# Patient Record
Sex: Female | Born: 1980 | Race: White | Hispanic: No | Marital: Married | Smoking: Never smoker
Health system: Southern US, Community
[De-identification: ages and names within clinical notes are randomized; demographics above are authoritative.]

## PROBLEM LIST (undated history)

## (undated) DIAGNOSIS — J45909 Unspecified asthma, uncomplicated: Secondary | ICD-10-CM

---

## 2014-06-04 DIAGNOSIS — K644 Residual hemorrhoidal skin tags: Secondary | ICD-10-CM | POA: Insufficient documentation

## 2015-12-09 DIAGNOSIS — F419 Anxiety disorder, unspecified: Secondary | ICD-10-CM | POA: Insufficient documentation

## 2019-06-21 DIAGNOSIS — U071 COVID-19: Secondary | ICD-10-CM

## 2019-06-21 HISTORY — DX: COVID-19: U07.1

## 2019-06-30 ENCOUNTER — Emergency Department: Payer: BC Managed Care – PPO

## 2019-06-30 ENCOUNTER — Encounter: Payer: Self-pay | Admitting: Emergency Medicine

## 2019-06-30 ENCOUNTER — Emergency Department
Admission: EM | Admit: 2019-06-30 | Discharge: 2019-06-30 | Disposition: A | Discharge status: Home / Self Care | Payer: BC Managed Care – PPO | Attending: Emergency Medicine | Admitting: Emergency Medicine

## 2019-06-30 ENCOUNTER — Other Ambulatory Visit: Payer: Self-pay

## 2019-06-30 DIAGNOSIS — J45909 Unspecified asthma, uncomplicated: Secondary | ICD-10-CM | POA: Insufficient documentation

## 2019-06-30 DIAGNOSIS — U071 COVID-19: Secondary | ICD-10-CM | POA: Diagnosis not present

## 2019-06-30 HISTORY — DX: Unspecified asthma, uncomplicated: J45.909

## 2019-06-30 NOTE — ED Triage Notes (Addendum)
FIRST NURSE NOTE- here for [facility] and lungs feeling tight.  covid + on 06/24/18.  Sx since end DEC. sats 100% at check in.  No increased WOB noted after ambulation.

## 2019-06-30 NOTE — ED Triage Notes (Signed)
Pt arrived via POV with reports of cough, shortness of breath, pt has hx of asthma as well.   Pt states she was dx with COVID on 06/25/19.  Pt states sxs started on Dec 26.   No distress noted in triage, pt speaking in complete sentences without difficulty.

## 2019-06-30 NOTE — Discharge Instructions (Addendum)
Follow-up with your primary care provider or [facility] acute care if any continued problems.  Return to the emergency department if any severe worsening of your symptoms or shortness of breath.  Drink lots of fluids to stay hydrated.  You may also take Tylenol as needed for fever, body aches or headache.

## 2019-06-30 NOTE — ED Notes (Signed)
See triage note  States she developed COVID sx's on 12/26  Then she was tested positive on 01/005/2021    States she has had cough for about 10 days   States today has had some discomfort in chest with inspiration   Also has a hx of anxiety   Not sure if this is adding to her sx's today

## 2019-06-30 NOTE — ED Provider Notes (Signed)
[facility] Emergency Department Provider Note   ____________________________________________   First MD Initiated Contact with Patient 06/30/19 1404     (approximate)  I have reviewed the triage vital signs and the nursing notes.   HISTORY  Chief Complaint Cough and COVID Positive   HPI Kathleen Greene is a 39 y.o. female presents to the ED with sensation of shortness of breath and tightness.  Patient states that she was diagnosed with Covid on 06/24/2018 although she began having symptoms since December 26.  Patient states that she does have a history of asthma and has used her inhaler a few times.  She was able to get out yesterday and walk with her husband.  Patient is a non-smoker.  She denies any recent fever or chills.  She states that her taste and smell have returned.  She denies any GI symptoms.  He rates her pain as a 2 out of 10.      Past Medical History:  Diagnosis Date  . Asthma     There are no problems to display for this patient.   History reviewed. No pertinent surgical history.  Prior to Admission medications   Medication Sig Start Date End Date Taking? Authorizing Provider  albuterol (VENTOLIN HFA) 108 (90 Base) MCG/ACT inhaler Inhale 2 puffs into the lungs every 6 (six) hours as needed for wheezing or shortness of breath.   Yes [provider]    Allergies Patient has no known allergies.  No family history on file.  Social History Social History   Tobacco Use  . Smoking status: Never Smoker  . Smokeless tobacco: Never Used  Substance Use Topics  . Alcohol use: Not on file  . Drug use: Not on file    Review of Systems Constitutional: No fever/chills Eyes: No visual changes. ENT: No sore throat. Cardiovascular: Denies chest pain. Respiratory: Denies shortness of breath.  Positive for occasional dry cough. Gastrointestinal: No abdominal pain.  No nausea, no vomiting.  No diarrhea.   Genitourinary: Negative for  dysuria. Musculoskeletal: Negative for muscle aches. Skin: Negative for rash. Neurological: Negative for headaches, focal weakness or numbness. ___________________________________________   PHYSICAL EXAM:  VITAL SIGNS: ED Triage Vitals  Enc Vitals Group     BP 06/30/19 1325 131/90     Pulse Rate 06/30/19 1325 (!) 106     Resp 06/30/19 1325 18     Temp 06/30/19 1325 98.2 F (36.8 C)     Temp Source 06/30/19 1325 Oral     SpO2 06/30/19 1325 100 %     Weight 06/30/19 1324 130 lb (59 kg)     Height 06/30/19 1324 5\' 7"  (1.702 m)     Head Circumference --      Peak Flow --      Pain Score 06/30/19 1323 2     Pain Loc --      Pain Edu? --      Excl. in [place]? --     Constitutional: Alert and oriented. Well appearing and in no acute distress.  Patient is pleasant and is able to sit and talk in complete sentences without any respiratory distress. Eyes: Conjunctivae are normal.  Head: Atraumatic. Neck: No stridor.   Cardiovascular: Normal rate, regular rhythm. Grossly normal heart sounds.  Good peripheral circulation. Respiratory: Normal respiratory effort.  No retractions. Lungs CTAB.  No wheezes, rales or rhonchi are noted. Gastrointestinal: Soft and nontender. No distention. No abdominal bruits.  Musculoskeletal: Moves upper and lower extremities  with any difficulty.  Normal gait was noted. Neurologic:  Normal speech and language. No gross focal neurologic deficits are appreciated. No gait instability. Skin:  Skin is warm, dry and intact. No rash noted. Psychiatric: Mood and affect are normal. Speech and behavior are normal.  ____________________________________________   LABS (all labs ordered are listed, but only abnormal results are displayed)  Labs Reviewed - No data to display  RADIOLOGY  Official radiology report(s): DG Chest 2 View  Result Date: 06/30/2019 CLINICAL DATA:  Shortness of breath.  COVID-19 positive.  Cough. EXAM: CHEST - 2 VIEW COMPARISON:  None.  FINDINGS: The heart size and mediastinal contours are within normal limits. Both lungs are clear. The visualized skeletal structures are unremarkable. IMPRESSION: No active cardiopulmonary disease. Electronically Signed   By: Gerome Sam III M.D   On: 06/30/2019 14:07    ____________________________________________   PROCEDURES  Procedure(s) performed (including Critical Care):  Procedures   ____________________________________________   INITIAL IMPRESSION / ASSESSMENT AND PLAN / ED COURSE  As part of my medical decision making, I reviewed the following data within the electronic MEDICAL RECORD NUMBER Notes from prior ED visits and [place] Controlled Substance Database  Kathleen Greene was evaluated in Emergency Department on 06/30/2019 for the symptoms described in the history of present illness. She was evaluated in the context of the global COVID-19 pandemic, which necessitated consideration that the patient might be at risk for infection with the SARS-CoV-2 virus that causes COVID-19. Institutional protocols and algorithms that pertain to the evaluation of patients at risk for COVID-19 are in a state of rapid change based on information released by regulatory bodies including the CDC and federal and state organizations. These policies and algorithms were followed during the patient's care in the ED.  39 year old female presents to the ED with some concerns about coughing and shortness of breath after being diagnosed with Covid.  Patient states that her initial symptoms began on December 26 and she was diagnosed on 06/25/2019.  Patient states there is been no recent fever or chills, her taste and smell have returned and she is eating well.  Patient has in the past had a history of asthma and has a inhaler that she is used infrequently.  Patient states she was able to walk yesterday outside with her husband.  Chest x-ray is reassuring.  Clinically is doing well.  She is afebrile and O2 sat was 100%.  She is  reassured.  She is encouraged to use her inhaler as needed.  She is aware that she should return to the emergency department if any difficulty breathing or shortness of breath.  ____________________________________________   FINAL CLINICAL IMPRESSION(S) / ED DIAGNOSES  Final diagnoses:  COVID-19 virus infection     ED Discharge Orders    None       Note:  This document was prepared using Dragon voice recognition software and may include unintentional dictation errors.    Tommi Rumps, PA-C 06/30/19 1547    Minna Antis, MD 06/30/19 [phone]

## 2020-02-19 ENCOUNTER — Ambulatory Visit (INDEPENDENT_AMBULATORY_CARE_PROVIDER_SITE_OTHER): Payer: BC Managed Care – PPO

## 2020-02-19 ENCOUNTER — Encounter: Payer: Self-pay | Admitting: Emergency Medicine

## 2020-02-19 ENCOUNTER — Ambulatory Visit
Admission: EM | Admit: 2020-02-19 | Discharge: 2020-02-19 | Disposition: A | Discharge status: Home / Self Care | Payer: BC Managed Care – PPO | Attending: Emergency Medicine | Admitting: Emergency Medicine

## 2020-02-19 ENCOUNTER — Other Ambulatory Visit: Payer: Self-pay

## 2020-02-19 DIAGNOSIS — R0789 Other chest pain: Secondary | ICD-10-CM

## 2020-02-19 DIAGNOSIS — R05 Cough: Secondary | ICD-10-CM

## 2020-02-19 DIAGNOSIS — R42 Dizziness and giddiness: Secondary | ICD-10-CM | POA: Diagnosis not present

## 2020-02-19 DIAGNOSIS — Z8616 Personal history of COVID-19: Secondary | ICD-10-CM

## 2020-02-19 DIAGNOSIS — J4520 Mild intermittent asthma, uncomplicated: Secondary | ICD-10-CM | POA: Diagnosis not present

## 2020-02-19 MED ORDER — MECLIZINE HCL 25 MG PO TABS: 25.0000 mg | ORAL_TABLET | Freq: Three times a day (TID) | ORAL | 0 refills | Status: DC | PRN

## 2020-02-19 MED ORDER — ALBUTEROL SULFATE HFA 108 (90 BASE) MCG/ACT IN AERS: 1.0000 | INHALATION_SPRAY | RESPIRATORY_TRACT | 0 refills | Status: DC | PRN

## 2020-02-19 MED ORDER — MONTELUKAST SODIUM 10 MG PO TABS: 10.0000 mg | ORAL_TABLET | Freq: Every day | ORAL | 0 refills | Status: DC

## 2020-02-19 MED ORDER — AEROCHAMBER PLUS MISC: 2 refills | Status: AC

## 2020-02-19 MED ORDER — ALBUTEROL SULFATE HFA 108 (90 BASE) MCG/ACT IN AERS: 1.0000 | INHALATION_SPRAY | Freq: Four times a day (QID) | RESPIRATORY_TRACT | 0 refills | Status: DC | PRN

## 2020-02-19 NOTE — ED Triage Notes (Addendum)
Patient reports a dry cough for over 1 month. She also reports dizzy spells on/off for 1 month. She states the last 2 days have been worse. She states she tested negative for COVID 2 weeks ago. She also reports that she was positive for COVID in January.

## 2020-02-19 NOTE — Discharge Instructions (Signed)
Take 2 puffs from your albuterol inhaler using your spacer as needed for chest tightness.  I am starting you on Singulair to help prevent bronchospasm and to better control your asthma.  Suspect that this is your asthma.  Continue drinking plenty of fluids, you may want to start drinking some electrolyte containing fluids such as Pedialyte, especially on hot days.  Try the meclizine.

## 2020-02-19 NOTE — ED Provider Notes (Addendum)
HPI  SUBJECTIVE:  Kathleen Greene is a 39 y.o. female who presents with 2 issues.  First, she presents with 1 month of intermittent dizziness described as being lightheaded/off balance and unsteadiness.  She does not describe it as being like vertigo.  It is intermittent, lasting minutes.  No nausea.  Occasional mild headache but not consistently so.  She reports generalized weakness when she is feeling this way.  She states that she has had some left ear fullness, but she is not sure how long it has been there.  She denies ear pain, tinnitus.  No unilateral arm or leg weakness, facial droop, slurred speech.  Reports some diffuse chest tightness and a dry cough that is occasionally associated with the dizziness, but it is not consistently associated with it.  No chest pressure or heaviness, shortness of breath, palpitations, syncope.  Does not take any medications on a regular basis.  No vomiting, nausea, diarrhea.  She states that she drinks over 64 ounces of water a day.    She has tried Dramamine, ibuprofen without improvement in her symptoms.  Symptoms seem to happen when she starts to fall asleep, when she turns her head, and with large positional changes.  She had symptoms like this before when she had Covid in January 2021.  Negative Covid test 12 days ago.  Second, she reports 1 month of occasional intermittent diffuse chest tightness accompanied with a dry cough.  It can last anywhere from 30 to 45 minutes.  She reports occasional expiratory wheezing, but not consistently so.  No shortness of breath.  It is not associated with exertion.  There is no positional component to it.  She denies nasal congestion, sinus pain or pressure, fevers, postnasal drip, GERD symptoms.  No night sweats, unintentional weight loss, allergy symptoms.  This pain does not radiate up her neck, down her arm or through to her back.  No diaphoresis or nausea with this chest tightness.  She is using her rescue albuterol inhaler  several times a day and states that this helped significantly.  She does not have a spacer with it.  Symptoms are worse with going out into the heat, and when she gets anxious.  She denies surgery in the past 4 weeks, recent immobilization, hemoptysis, exogenous estrogen, calf pain or swelling.  She has a past medical history of asthma, IBS, Covid, anxiety, allergies.  No history of diabetes, hypertension, vertigo, stroke, smoking, DVT, PE, cancer, MI, hypercholesterolemia.    Past Medical History:  Diagnosis Date  . Asthma   . COVID-19 06/2019  . IBS (irritable colon syndrome)     History reviewed. No pertinent surgical history.  Family History  Problem Relation Age of Onset  . Idiopathic pulmonary fibrosis Father     Social History   Tobacco Use  . Smoking status: Never Smoker  . Smokeless tobacco: Never Used  Vaping Use  . Vaping Use: Never used  Substance Use Topics  . Alcohol use: Yes  . Drug use: Never    No current facility-administered medications for this encounter.  Current Outpatient Medications:  .  docusate sodium (COLACE) 100 MG capsule, Take 100 mg by mouth 2 (two) times daily., Disp: , Rfl:  .  psyllium (METAMUCIL) 58.6 % packet, Take 1 packet by mouth daily., Disp: , Rfl:  .  albuterol (VENTOLIN HFA) 108 (90 Base) MCG/ACT inhaler, Inhale 1-2 puffs into the lungs every 4 (four) hours as needed for wheezing or shortness of breath., Disp: 1 each,  Rfl: 0 .  meclizine (ANTIVERT) 25 MG tablet, Take 1 tablet (25 mg total) by mouth 3 (three) times daily as needed for dizziness., Disp: 30 tablet, Rfl: 0 .  montelukast (SINGULAIR) 10 MG tablet, Take 1 tablet (10 mg total) by mouth at bedtime., Disp: 30 tablet, Rfl: 0 .  Spacer/Aero-Holding Chambers (AEROCHAMBER PLUS) inhaler, Use as instructed, Disp: 1 each, Rfl: 2  Allergies  Allergen Reactions  . Amoxicillin      ROS  As noted in HPI.   Physical Exam  BP (!) 145/100 (BP Location: Right Arm)   Pulse 99    Temp 98.4 F (36.9 C) (Oral)   Resp 18   Ht 5\' 7"  (1.702 m)   Wt 61.2 kg   LMP 02/05/2020   SpO2 100%   BMI 21.14 kg/m   Constitutional: Well developed, well nourished, no acute distress Eyes:  EOMI, conjunctiva normal bilaterally HENT: Normocephalic, atraumatic,mucus membranes moist TMs normal bilaterally. Respiratory: Normal inspiratory effort lungs clear bilaterally Cardiovascular: Normal rate regular rhythm no murmurs rubs or gallops.  No carotid bruit GI: nondistended skin: No rash, skin intact Musculoskeletal: Calves symmetric, nontender, no edema. Neurologic: Alert & oriented x 3, cranial nerves III through XII intact.  Finger-nose, heel shin within normal limits.  Dix-Hallpike negative.  Romberg negative.  Tandem gait steady. Psychiatric: Speech and behavior appropriate   ED Course   Medications - No data to display  Orders Placed This Encounter  Procedures  . DG Chest 2 View    Standing Status:   Standing    Number of Occurrences:   1    Order Specific Question:   Reason for Exam (SYMPTOM  OR DIAGNOSIS REQUIRED)    Answer:   cough x 1 month  . ED EKG    Standing Status:   Standing    Number of Occurrences:   1    Order Specific Question:   Reason for Exam    Answer:   Other (See Comments)  . EKG 12-Lead    Standing Status:   Standing    Number of Occurrences:   1    No results found for this or any previous visit (from the past 24 hour(s)). DG Chest 2 View  Result Date: 02/19/2020 CLINICAL DATA:  One month history of cough and intermittent dizziness which has worsened over the past 2 days. Patient tested negative for COVID-19 2 weeks ago but states that she was positive in January, 2021. EXAM: CHEST - 2 VIEW COMPARISON:  06/30/2019. FINDINGS: Cardiomediastinal silhouette unremarkable. Lungs clear. Bronchovascular markings normal. Pulmonary vascularity normal. No pneumothorax. No pleural effusions. Visualized bony thorax intact. No interval change. IMPRESSION:  Normal examination. Electronically Signed   By: 08/28/2019 M.D.   On: 02/19/2020 16:45    ED Clinical Impression  1. Chest tightness   2. Intermittent asthma without complication, unspecified asthma severity   3. Lightheadedness      ED Assessment/Plan  1.  Chest tightness/cough.  In the differential is bronchospasm, poorly controlled asthma, GERD.  She has a reassuring EKG and chest x-ray.  She is PERC negative.  Doubt ACS.  Given that response to bronchodilators and with a history of asthma, suspect bronchospasm/poorly controlled asthma.  Will refill her albuterol inhaler, give her a spacer, and restart her on Singulair.  States that she has been on this before with excellent results.  EKG: Normal sinus rhythm, rate 88.  Normal axis, normal intervals.  No hypertrophy.  No ST-T wave changes.  No previous EKG for comparison.  She was asymptomatic while EKG was obtained.  Reviewed imaging independently.  Normal chest x-ray.  See radiology report for full details.  2.  Lightheadedness/dizziness.  Seems to be aggravated with head rotation and large positional changes.  She has had no syncope.  Could be some orthostasis, however patient is not dehydrated.  She has no reason to be dehydrated.  Doubt electrolyte imbalance, or acute anemia.  Thus labs were deferred.  There is no evidence of a stroke, meniere's, otitis media.  It does not appear to be an emergency.  will try some meclizine, continue pushing electrolyte containing fluids.  She is establishing care with a primary care physician at [facility] on September 20.  She may return here if she starts to get worse.   Discussed  MDM, treatment plan, and plan for follow-up with patient. Discussed sn/sx that should prompt return to the ED. patient agrees with plan.   Meds ordered this encounter  Medications  . DISCONTD: albuterol (VENTOLIN HFA) 108 (90 Base) MCG/ACT inhaler    Sig: Inhale 1-2 puffs into the lungs every 6 (six)  hours as needed for wheezing or shortness of breath.    Dispense:  1 each    Refill:  0  . Spacer/Aero-Holding Chambers (AEROCHAMBER PLUS) inhaler    Sig: Use as instructed    Dispense:  1 each    Refill:  2  . montelukast (SINGULAIR) 10 MG tablet    Sig: Take 1 tablet (10 mg total) by mouth at bedtime.    Dispense:  30 tablet    Refill:  0  . meclizine (ANTIVERT) 25 MG tablet    Sig: Take 1 tablet (25 mg total) by mouth 3 (three) times daily as needed for dizziness.    Dispense:  30 tablet    Refill:  0  . albuterol (VENTOLIN HFA) 108 (90 Base) MCG/ACT inhaler    Sig: Inhale 1-2 puffs into the lungs every 4 (four) hours as needed for wheezing or shortness of breath.    Dispense:  1 each    Refill:  0    *This clinic note was created using Scientist, clinical (histocompatibility and immunogenetics). Therefore, there may be occasional mistakes despite careful proofreading.   ?    Domenick Gong, MD 02/19/20 1735    Domenick Gong, MD 02/19/20 [phone]

## 2020-02-21 ENCOUNTER — Other Ambulatory Visit: Payer: Self-pay

## 2020-02-21 ENCOUNTER — Encounter: Payer: Self-pay | Admitting: Emergency Medicine

## 2020-02-21 ENCOUNTER — Ambulatory Visit
Admission: EM | Admit: 2020-02-21 | Discharge: 2020-02-21 | Disposition: A | Discharge status: Home / Self Care | Payer: BC Managed Care – PPO | Attending: Emergency Medicine | Admitting: Emergency Medicine

## 2020-02-21 DIAGNOSIS — J45909 Unspecified asthma, uncomplicated: Secondary | ICD-10-CM | POA: Diagnosis not present

## 2020-02-21 DIAGNOSIS — Z8616 Personal history of COVID-19: Secondary | ICD-10-CM | POA: Insufficient documentation

## 2020-02-21 DIAGNOSIS — Z79899 Other long term (current) drug therapy: Secondary | ICD-10-CM | POA: Diagnosis not present

## 2020-02-21 DIAGNOSIS — R42 Dizziness and giddiness: Secondary | ICD-10-CM | POA: Diagnosis present

## 2020-02-21 DIAGNOSIS — R531 Weakness: Secondary | ICD-10-CM | POA: Diagnosis not present

## 2020-02-21 DIAGNOSIS — R5383 Other fatigue: Secondary | ICD-10-CM | POA: Diagnosis not present

## 2020-02-21 DIAGNOSIS — R448 Other symptoms and signs involving general sensations and perceptions: Secondary | ICD-10-CM | POA: Diagnosis not present

## 2020-02-21 DIAGNOSIS — H53149 Visual discomfort, unspecified: Secondary | ICD-10-CM | POA: Insufficient documentation

## 2020-02-21 DIAGNOSIS — Z880 Allergy status to penicillin: Secondary | ICD-10-CM | POA: Diagnosis not present

## 2020-02-21 LAB — CBC WITH DIFFERENTIAL/PLATELET
Abs Immature Granulocytes: 0.02 10*3/uL (ref 0.00–0.07)
Basophils Absolute: 0 10*3/uL (ref 0.0–0.1)
Basophils Relative: 0 %
Eosinophils Absolute: 0.1 10*3/uL (ref 0.0–0.5)
Eosinophils Relative: 2 %
HCT: 41.2 % (ref 36.0–46.0)
Hemoglobin: 14.3 g/dL (ref 12.0–15.0)
Immature Granulocytes: 0 %
Lymphocytes Relative: 20 %
Lymphs Abs: 1.6 10*3/uL (ref 0.7–4.0)
MCH: 31.2 pg (ref 26.0–34.0)
MCHC: 34.7 g/dL (ref 30.0–36.0)
MCV: 89.8 fL (ref 80.0–100.0)
Monocytes Absolute: 0.6 10*3/uL (ref 0.1–1.0)
Monocytes Relative: 7 %
Neutro Abs: 5.6 10*3/uL (ref 1.7–7.7)
Neutrophils Relative %: 71 %
Platelets: 220 10*3/uL (ref 150–400)
RBC: 4.59 MIL/uL (ref 3.87–5.11)
RDW: 12.3 % (ref 11.5–15.5)
WBC: 8 10*3/uL (ref 4.0–10.5)
nRBC: 0 % (ref 0.0–0.2)

## 2020-02-21 LAB — COMPREHENSIVE METABOLIC PANEL
ALT: 14 U/L (ref 0–44)
AST: 19 U/L (ref 15–41)
Albumin: 4.7 g/dL (ref 3.5–5.0)
Alkaline Phosphatase: 32 U/L — ABNORMAL LOW (ref 38–126)
Anion gap: 9 (ref 5–15)
BUN: 9 mg/dL (ref 6–20)
CO2: 29 mmol/L (ref 22–32)
Calcium: 9.5 mg/dL (ref 8.9–10.3)
Chloride: 102 mmol/L (ref 98–111)
Creatinine, Ser: 0.66 mg/dL (ref 0.44–1.00)
GFR calc Af Amer: >60 mL/min (ref >60)
GFR calc non Af Amer: >60 mL/min (ref >60)
Glucose, Bld: 93 mg/dL (ref 70–99)
Potassium: 3.7 mmol/L (ref 3.5–5.1)
Sodium: 140 mmol/L (ref 135–145)
Total Bilirubin: 0.5 mg/dL (ref 0.3–1.2)
Total Protein: 7.8 g/dL (ref 6.5–8.1)

## 2020-02-21 LAB — URINALYSIS, COMPLETE (UACMP) WITH MICROSCOPIC
Bilirubin Urine: NEGATIVE
Glucose, UA: NEGATIVE mg/dL
Hgb urine dipstick: NEGATIVE
Nitrite: NEGATIVE
Protein, ur: NEGATIVE mg/dL
RBC / HPF: NONE SEEN RBC/hpf (ref 0–5)
Specific Gravity, Urine: 1.02 (ref 1.005–1.030)
pH: 6 (ref 5.0–8.0)

## 2020-02-21 LAB — PREGNANCY, URINE: Preg Test, Ur: NEGATIVE

## 2020-02-21 LAB — TSH: TSH: 2.426 u[IU]/mL (ref 0.350–4.500)

## 2020-02-21 MED ORDER — KETOROLAC TROMETHAMINE 60 MG/2ML IM SOLN
60.0000 mg | Freq: Once | INTRAMUSCULAR | Status: AC
  Administered 2020-02-21: 60 mg via INTRAMUSCULAR

## 2020-02-21 MED ORDER — PREDNISONE 10 MG (21) PO TBPK: ORAL_TABLET | Freq: Every day | ORAL | 0 refills | Status: DC

## 2020-02-21 NOTE — ED Triage Notes (Signed)
Patient c/o light headedness off and on for a month.  Patient denies fever.  Patient states that she was seen here on 02/19/20.

## 2020-02-21 NOTE — ED Provider Notes (Signed)
[facility]    CSN: [id number] Arrival date & time: 02/21/20  1656      History   Chief Complaint Chief Complaint  Patient presents with  . Dizziness    HPI Kathleen Greene is a 39 y.o. female.   Larey Seat presents with complaints of light headedness which has been persistent. Waxes and wanes. Feels weakness and fatigued. She wants to lay down because of sensation. Feels some temple pressure bilaterally. This has been coming and going. Intensity can wax and wane overall. Lights can worsens symptoms, such as going into fluorescent lighting. She states she feels unstable. Symptoms started over a month ago. Has been worse over the past few days. Had covid-19 in January and had somewhat similar sensation of lightheadedness. Recent negative covid testing again. Was seen here on 9/1, also with some asthma symptoms and was started on singuilar, which has helped. "stuffy" feeling but not entirely congested. History of anemia. Last month did have a heavy period for a day. Recent back injury and had been taking aspirin. LMP was 8/18. Not on any birth control. No history of migraines. Sound can worsen symptoms. Has had some occasional headaches in the past but nothing like this. Has been under increased stress recently. Endorses some anxiety but has never had to treat it in the past. Currently doesn't feel light headed. Feels "slightly woozy".  No head injury. No vision changes. Did try meclizine which hasn't helped. Has also taken [facility] occasionally which helps some.   No current chest pain  Or palpitations.   ROS per HPI, negative if not otherwise mentioned.      Past Medical History:  Diagnosis Date  . Asthma   . COVID-19 06/2019  . IBS (irritable colon syndrome)     There are no problems to display for this patient.   History reviewed. No pertinent surgical history.  OB History   No obstetric history on file.      Home Medications    Prior to Admission medications     Medication Sig Start Date End Date Taking? Authorizing Provider  docusate sodium (COLACE) 100 MG capsule Take 100 mg by mouth 2 (two) times daily.   Yes [provider]  meclizine (ANTIVERT) 25 MG tablet Take 1 tablet (25 mg total) by mouth 3 (three) times daily as needed for dizziness. 02/19/20  Yes Domenick Gong, MD  montelukast (SINGULAIR) 10 MG tablet Take 1 tablet (10 mg total) by mouth at bedtime. 02/19/20  Yes Domenick Gong, MD  albuterol (VENTOLIN HFA) 108 (90 Base) MCG/ACT inhaler Inhale 1-2 puffs into the lungs every 4 (four) hours as needed for wheezing or shortness of breath. 02/19/20   Domenick Gong, MD  predniSONE (STERAPRED UNI-PAK 21 TAB) 10 MG (21) TBPK tablet Take by mouth daily. Per box instruction 02/21/20   Linus Mako B, NP  psyllium (METAMUCIL) 58.6 % packet Take 1 packet by mouth daily.    [provider]  Spacer/Aero-Holding Chambers (AEROCHAMBER PLUS) inhaler Use as instructed 02/19/20   Domenick Gong, MD    Family History Family History  Problem Relation Age of Onset  . Idiopathic pulmonary fibrosis Father     Social History Social History   Tobacco Use  . Smoking status: Never Smoker  . Smokeless tobacco: Never Used  Vaping Use  . Vaping Use: Never used  Substance Use Topics  . Alcohol use: Yes  . Drug use: Never     Allergies   Amoxicillin   Review of  Systems Review of Systems   Physical Exam Triage Vital Signs ED Triage Vitals  Enc Vitals Group     BP 02/21/20 1719 130/85     Pulse Rate 02/21/20 1719 (!) 104     Resp 02/21/20 1719 14     Temp 02/21/20 1719 98.4 F (36.9 C)     Temp Source 02/21/20 1719 Oral     SpO2 02/21/20 1719 100 %     Weight 02/21/20 1716 135 lb (61.2 kg)     Height 02/21/20 1716 5\' 7"  (1.702 m)     Head Circumference --      Peak Flow --      Pain Score 02/21/20 1716 0     Pain Loc --      Pain Edu? --      Excl. in GC? --    No data found.  Updated Vital Signs BP 130/85 (BP  Location: Right Arm)   Pulse (!) 104   Temp 98.4 F (36.9 C) (Oral)   Resp 14   Ht 5\' 7"  (1.702 m)   Wt 135 lb (61.2 kg)   LMP 02/05/2020   SpO2 100%   BMI 21.14 kg/m   Visual Acuity Right Eye Distance:   Left Eye Distance:   Bilateral Distance:    Right Eye Near:   Left Eye Near:    Bilateral Near:     Physical Exam Constitutional:      General: She is not in acute distress.    Appearance: She is well-developed.  HENT:     Right Ear: Tympanic membrane and ear canal normal.     Left Ear: Tympanic membrane and ear canal normal.     Mouth/Throat:     Mouth: Mucous membranes are moist.  Eyes:     Pupils: Pupils are equal, round, and reactive to light.  Cardiovascular:     Rate and Rhythm: Normal rate.  Pulmonary:     Effort: Pulmonary effort is normal.  Skin:    General: Skin is warm and dry.  Neurological:     General: No focal deficit present.     Mental Status: She is alert and oriented to person, place, and time. Mental status is at baseline.     Cranial Nerves: No cranial nerve deficit.      UC Treatments / Results  Labs (all labs ordered are listed, but only abnormal results are displayed) Labs Reviewed  COMPREHENSIVE METABOLIC PANEL - Abnormal; Notable for the following components:      Result Value   Alkaline Phosphatase 32 (*)    All other components within normal limits  URINALYSIS, COMPLETE (UACMP) WITH MICROSCOPIC - Abnormal; Notable for the following components:   Ketones, ur TRACE (*)    Leukocytes,Ua TRACE (*)    Bacteria, UA FEW (*)    All other components within normal limits  URINE CULTURE  CBC WITH DIFFERENTIAL/PLATELET  PREGNANCY, URINE  TSH    EKG   Radiology No results found.  Procedures Procedures (including critical care time)  Medications Ordered in UC Medications  ketorolac (TORADOL) injection 60 mg (60 mg Intramuscular Given 02/21/20 1806)    Initial Impression / Assessment and Plan / UC Course  I have reviewed the  triage vital signs and the nursing notes.  Pertinent labs & imaging results that were available during my care of the patient were reviewed by me and considered in my medical decision making (see chart for details).     Non toxic. Benign  physical exam. Migraine variant vs sinus/ inner ear source vs anxiety vs covid long haul type symptoms discussed and considered. ekg and chest xray on 9/1 were unremarkable. Cbc normal here tonight. Trace leuks and bacteria to urine with culture pending. tsh and CMP pending. toradol provided as a migraine treatment, provided prednisone as headache/ sinus treatment option as well. Encouraged close follow up with PCP for recheck as may need further evaluation or management. Return precautions provided. Patient verbalized understanding and agreeable to plan.  Ambulatory out of clinic without difficulty.    Final Clinical Impressions(s) / UC Diagnoses   Final diagnoses:  Light headedness  Photophobia  Facial pressure     Discharge Instructions     Migraine variant vs sinus congestion/ pressure vs covid long haul vs anxiety are things I am considering tonight.  We will check to ensure you have a normal thyroid function, as well as normal electrolytes as well to ensure these are not contributing to your symptoms.  Your urine has slight abnormal findings so I will send it to be cultured to confirm no UTI. Your results will be on your MyChart, I would call you if I am otherwise concerned.  Your CBC has resulted already- you are not anemic.  I am hopeful that the medication we give you tonight helps with the head pressure sensation.  I think you can try a steroid pack as well in hopes that this helps with sinus pressure as well as headache.  Please follow up with your primary care provider for recheck as you may need continued evaluation and management if symptoms persist.     ED Prescriptions    Medication Sig Dispense Auth. Provider   predniSONE (STERAPRED  UNI-PAK 21 TAB) 10 MG (21) TBPK tablet Take by mouth daily. Per box instruction 21 tablet Georgetta Haber, NP     PDMP not reviewed this encounter.   Georgetta Haber, NP 02/21/20 [phone]

## 2020-02-21 NOTE — Discharge Instructions (Addendum)
Migraine variant vs sinus congestion/ pressure vs covid long haul vs anxiety are things I am considering tonight.  We will check to ensure you have a normal thyroid function, as well as normal electrolytes as well to ensure these are not contributing to your symptoms.  Your urine has slight abnormal findings so I will send it to be cultured to confirm no UTI. Your results will be on your MyChart, I would call you if I am otherwise concerned.  Your CBC has resulted already- you are not anemic.  I am hopeful that the medication we give you tonight helps with the head pressure sensation.  I think you can try a steroid pack as well in hopes that this helps with sinus pressure as well as headache.  Please follow up with your primary care provider for recheck as you may need continued evaluation and management if symptoms persist.

## 2020-02-23 LAB — URINE CULTURE: Culture: NO GROWTH

## 2020-02-26 ENCOUNTER — Encounter: Payer: Self-pay | Admitting: Emergency Medicine

## 2020-02-26 ENCOUNTER — Other Ambulatory Visit: Payer: Self-pay

## 2020-02-26 ENCOUNTER — Emergency Department
Admission: EM | Admit: 2020-02-26 | Discharge: 2020-02-26 | Disposition: A | Discharge status: Home / Self Care | Payer: BC Managed Care – PPO | Attending: Emergency Medicine | Admitting: Emergency Medicine

## 2020-02-26 DIAGNOSIS — Z5321 Procedure and treatment not carried out due to patient leaving prior to being seen by health care provider: Secondary | ICD-10-CM | POA: Insufficient documentation

## 2020-02-26 DIAGNOSIS — R5383 Other fatigue: Secondary | ICD-10-CM | POA: Insufficient documentation

## 2020-02-26 DIAGNOSIS — R42 Dizziness and giddiness: Secondary | ICD-10-CM | POA: Diagnosis not present

## 2020-02-26 LAB — POCT PREGNANCY, URINE: Preg Test, Ur: NEGATIVE

## 2020-02-26 NOTE — ED Notes (Signed)
Pt asked for wait time, informed and states she can't wait that long and will follow back up with urgent care tomorrow.  Pt going home.

## 2020-02-26 NOTE — ED Triage Notes (Signed)
Pt to ED from home c/o fatigue, light headedness for 6 months that was been not improving, states came in today d/t bilateral legs "feel wobbly", denies falls.  Denies room spinning sensation, states feels disorienting.  Pt has strong grip strength bilat arms, ambulatory with steady gait, denies difference in sensation, chest rise even and unlabored, in NAD at this time.

## 2020-02-26 NOTE — ED Notes (Signed)
Spoke with Dr. Derrill Kay, no protocols at this time except for POC pregnancy.

## 2020-02-27 ENCOUNTER — Other Ambulatory Visit: Payer: Self-pay

## 2020-02-27 ENCOUNTER — Ambulatory Visit
Admission: EM | Admit: 2020-02-27 | Discharge: 2020-02-27 | Disposition: A | Discharge status: Home / Self Care | Payer: BC Managed Care – PPO | Attending: Internal Medicine | Admitting: Internal Medicine

## 2020-02-27 ENCOUNTER — Encounter: Payer: Self-pay | Admitting: Emergency Medicine

## 2020-02-27 ENCOUNTER — Ambulatory Visit (INDEPENDENT_AMBULATORY_CARE_PROVIDER_SITE_OTHER)
Admit: 2020-02-27 | Discharge: 2020-02-27 | Disposition: A | Discharge status: Home / Self Care | Payer: BC Managed Care – PPO | Attending: Internal Medicine | Admitting: Internal Medicine

## 2020-02-27 DIAGNOSIS — R5383 Other fatigue: Secondary | ICD-10-CM | POA: Insufficient documentation

## 2020-02-27 DIAGNOSIS — R42 Dizziness and giddiness: Secondary | ICD-10-CM | POA: Insufficient documentation

## 2020-02-27 DIAGNOSIS — R531 Weakness: Secondary | ICD-10-CM | POA: Diagnosis not present

## 2020-02-27 MED ORDER — IOHEXOL 300 MG/ML  SOLN
75.0000 mL | Freq: Once | INTRAMUSCULAR | Status: AC | PRN
  Administered 2020-02-27: 75 mL via INTRAVENOUS

## 2020-02-27 NOTE — ED Provider Notes (Signed)
[facility]    CSN: [id number] Arrival date & time: 02/27/20  1205      History   Chief Complaint Chief Complaint  Patient presents with  . Dizziness  . Fatigue    HPI Kathleen Greene is a 39 y.o. female who presents for the 3rd time with persistent light headness that last a few seconds and specially at night when she is starting to fall asleep and gets harder and wakes her up. Has had a loud crash noice in her ears x 1 in the past month. Two nights ago while on prednisone she felt chest tightness and the inhaler did not help and was able to breath fine. This tightness has resolved. Sometimes her heart feels it is feeling hard, but not related to her light headiness. She is having trouble thinking Yesterday while out felt unstable due to muscle weakness and feel heavy and mild achiness.  Denies any new meds or supplements.  Her great aunt on mother's side had an aneurysm. Has celiac dz  Has worked with [employer] and lead 1-2 months before symptoms started , but has wore gloves, but did not wear a mask when wearing sawdering and had well ventilated area.  denies any tick bites that she knows of. Had nl labs and EKG the past 2 times she has been here.  Past Medical History:  Diagnosis Date  . Asthma   . COVID-19 06/2019  . IBS (irritable colon syndrome)     There are no problems to display for this patient.   History reviewed. No pertinent surgical history.  OB History   No obstetric history on file.      Home Medications    Prior to Admission medications   Medication Sig Start Date End Date Taking? Authorizing Provider  albuterol (VENTOLIN HFA) 108 (90 Base) MCG/ACT inhaler Inhale 1-2 puffs into the lungs every 4 (four) hours as needed for wheezing or shortness of breath. 02/19/20  Yes Domenick Gong, MD  docusate sodium (COLACE) 100 MG capsule Take 100 mg by mouth 2 (two) times daily.   Yes [provider]  psyllium (METAMUCIL) 58.6 %  packet Take 1 packet by mouth daily.   Yes [provider]  meclizine (ANTIVERT) 25 MG tablet Take 1 tablet (25 mg total) by mouth 3 (three) times daily as needed for dizziness. 02/19/20   Domenick Gong, MD  montelukast (SINGULAIR) 10 MG tablet Take 1 tablet (10 mg total) by mouth at bedtime. 02/19/20   Domenick Gong, MD  predniSONE (STERAPRED UNI-PAK 21 TAB) 10 MG (21) TBPK tablet Take by mouth daily. Per box instruction 02/21/20   Georgetta Haber, NP  Spacer/Aero-Holding Chambers (AEROCHAMBER PLUS) inhaler Use as instructed 02/19/20   Domenick Gong, MD    Family History Family History  Problem Relation Age of Onset  . Idiopathic pulmonary fibrosis Father     Social History Social History   Tobacco Use  . Smoking status: Never Smoker  . Smokeless tobacco: Never Used  Vaping Use  . Vaping Use: Never used  Substance Use Topics  . Alcohol use: Yes  . Drug use: Never     Allergies   Amoxicillin   Review of Systems Review of Systems  Constitutional: Positive for activity change. Negative for appetite change, chills, diaphoresis, fatigue and fever.  HENT: Negative.   Eyes: Negative.   Respiratory: Negative.   Cardiovascular: Positive for palpitations.  Gastrointestinal: Negative.   Endocrine: Positive for heat intolerance. Negative for polydipsia,  polyphagia and polyuria.  Genitourinary: Negative for difficulty urinating and menstrual problem.  Musculoskeletal: Negative for arthralgias, back pain, gait problem and joint swelling.       Has chronic low back pain and is unchanged and thigh muscle aching is present since HA and dizziness  Skin: Negative for rash.  Allergic/Immunologic: Negative for environmental allergies.  Neurological: Positive for weakness, light-headedness and headaches. Negative for dizziness, tremors, facial asymmetry, speech difficulty and numbness.       Gets intermittent tingling on the soles of her feet  Hematological: Negative for  adenopathy. Does not bruise/bleed easily.  Psychiatric/Behavioral:       Has been grieving since her father died July 29, 2019   Physical Exam Triage Vital Signs ED Triage Vitals  Enc Vitals Group     BP 02/27/20 1236 124/87     Pulse Rate 02/27/20 1236 73     Resp 02/27/20 1236 18     Temp 02/27/20 1236 98.4 F (36.9 C)     Temp Source 02/27/20 1236 Oral     SpO2 02/27/20 1236 100 %     Weight 02/27/20 1234 134 lb 14.7 oz (61.2 kg)     Height 02/27/20 1234 5\' 7"  (1.702 m)     Head Circumference --      Peak Flow --      Pain Score 02/27/20 1234 0     Pain Loc --      Pain Edu? --      Excl. in GC? --    No data found.  Updated Vital Signs BP 124/87 (BP Location: Right Arm)   Pulse 73   Temp 98.4 F (36.9 C) (Oral)   Resp 18   Ht 5\' 7"  (1.702 m)   Wt 134 lb 14.7 oz (61.2 kg)   LMP 02/05/2020   SpO2 100%   BMI 21.13 kg/m   Visual Acuity Right Eye Distance:   Left Eye Distance:   Bilateral Distance:    Right Eye Near:   Left Eye Near:    Bilateral Near:     Physical Exam. Physical Exam Vitals signs and nursing note reviewed.  Constitutional:      General: He is not in acute distress.    Appearance: He is well-developed and normal weight. He is not ill-appearing, toxic-appearing or diaphoretic.  HENT:     Head: Normocephalic.  Eyes:     Extraocular Movements: Extraocular movements intact.     Pupils: Pupils are equal, round, and reactive to light.  Neck:     Musculoskeletal: Neck supple. No neck rigidity.     Meningeal: Brudzinski's sign absent.  Cardiovascular:     Rate and Rhythm: Normal rate and regular rhythm.     Heart sounds: No murmur.  Pulmonary:     Effort: Pulmonary effort is normal.     Breath sounds: Normal breath sounds. No wheezing, rhonchi or rales.  Abdominal:     General: Bowel sounds are normal.     Palpations: Abdomen is soft. There is no mass.     Tenderness: There is no abdominal tenderness. There is no guarding.  Musculoskeletal:  Normal range of motion.  Lymphadenopathy:     Cervical: No cervical adenopathy.  Skin:    General: Skin is warm and dry.  Neurological:     Mental Status: He is alert.     Cranial Nerves: No cranial nerve deficit or facial asymmetry.     Sensory: No sensory deficit.     Motor: No weakness.  Coordination: Romberg sign negative. Coordination normal.     Gait: Gait normal.     Deep Tendon Reflexes: Reflexes normal.     Comments: Normal Romberg, propioception, finger to nose, tandem gait.   Psychiatric:        Mood and Affect: Mood normal.        Speech: Speech normal.        Behavior: Behavior normal.   UC Treatments / Results  Labs (all labs ordered are listed, but only abnormal results are displayed) Labs Reviewed - No data to display  EKG   Radiology No results found.  Procedures Procedures (including critical care time)  Medications Ordered in UC Medications - No data to display  Initial Impression / Assessment and Plan / UC Course  I have reviewed the triage vital signs and the nursing notes. I will call her or sent her all the results via Mychart when they are back.  Final Clinical Impressions(s) / UC Diagnoses   Final diagnoses:  None   Discharge Instructions   None    ED Prescriptions    None     PDMP not reviewed this encounter.   Garey Ham, [place] 03/02/20 0712

## 2020-02-27 NOTE — Discharge Instructions (Addendum)
We will notify you when your test results are back.

## 2020-02-27 NOTE — ED Triage Notes (Signed)
Patient c/o light headedness and extreme muscle fatigue that has been going on x 6 weeks. She has been seen here twice for the same symptoms. She went to the ER last night but the wait is too long. She states she is wanting more imagine or possible referral to Neurology. She has a PCP appointment 9/20 for a new patient.

## 2020-02-28 LAB — HEAVY METALS, BLOOD
Arsenic: 1 ug/L — ABNORMAL LOW (ref 2–23)
Lead: <1 ug/dL (ref 0–4)
Mercury: <1 ug/L (ref 0.0–14.9)

## 2020-02-28 LAB — THYROID ANTIBODIES
Thyroglobulin Antibody: <1 IU/mL (ref 0.0–0.9)
Thyroperoxidase Ab SerPl-aCnc: 9 IU/mL (ref 0–34)

## 2020-03-02 LAB — LYME DISEASE, WESTERN BLOT
IgG P18 Ab.: ABSENT
IgG P23 Ab.: ABSENT
IgG P28 Ab.: ABSENT
IgG P30 Ab.: ABSENT
IgG P39 Ab.: ABSENT
IgG P41 Ab.: ABSENT
IgG P45 Ab.: ABSENT
IgG P58 Ab.: ABSENT
IgG P66 Ab.: ABSENT
IgG P93 Ab.: ABSENT
IgM P23 Ab.: ABSENT
IgM P39 Ab.: ABSENT
IgM P41 Ab.: ABSENT
Lyme IgG Wb: NEGATIVE
Lyme IgM Wb: NEGATIVE

## 2020-03-09 ENCOUNTER — Other Ambulatory Visit: Payer: Self-pay

## 2020-03-09 ENCOUNTER — Encounter: Payer: Self-pay | Admitting: Family Medicine

## 2020-03-09 ENCOUNTER — Ambulatory Visit (INDEPENDENT_AMBULATORY_CARE_PROVIDER_SITE_OTHER): Payer: BC Managed Care – PPO | Admitting: Family Medicine

## 2020-03-09 VITALS — BP 129/83 | HR 91 | Temp 97.8°F | Resp 16 | Ht 67.0 in | Wt 132.2 lb

## 2020-03-09 DIAGNOSIS — J4530 Mild persistent asthma, uncomplicated: Secondary | ICD-10-CM

## 2020-03-09 DIAGNOSIS — K5904 Chronic idiopathic constipation: Secondary | ICD-10-CM | POA: Diagnosis not present

## 2020-03-09 DIAGNOSIS — F419 Anxiety disorder, unspecified: Secondary | ICD-10-CM | POA: Diagnosis not present

## 2020-03-09 DIAGNOSIS — J3089 Other allergic rhinitis: Secondary | ICD-10-CM | POA: Insufficient documentation

## 2020-03-09 DIAGNOSIS — Z7689 Persons encountering health services in other specified circumstances: Secondary | ICD-10-CM

## 2020-03-09 MED ORDER — MONTELUKAST SODIUM 10 MG PO TABS: 10.0000 mg | ORAL_TABLET | Freq: Every day | ORAL | 3 refills | Status: DC

## 2020-03-09 MED ORDER — ALBUTEROL SULFATE HFA 108 (90 BASE) MCG/ACT IN AERS: 1.0000 | INHALATION_SPRAY | RESPIRATORY_TRACT | 2 refills | Status: DC | PRN

## 2020-03-09 NOTE — Patient Instructions (Addendum)
Thank you for coming to the office today.  Singulair (generic) re ordered 90 day with refills for up to 1 year  Added refills to inhaler, Albuterol  Mammogram will be ordered at time of next apt  Will review Neurology report and you can schedule with me whenever interested or needed.    DUE for FASTING BLOOD WORK (no food or drink after midnight before the lab appointment, only water or coffee without cream/sugar on the morning of)  In 1 year, can do lab AFTER appointment.    Please schedule a Follow-up Appointment to: Return in about 1 year (around 03/09/2021) for Follow-up 1 year for Annual Physical + Pap (fasting lab AFTER apt).  If you have any other questions or concerns, please feel free to call the office or send a message through MyChart. You may also schedule an earlier appointment if necessary.  Additionally, you may be receiving a survey about your experience at our office within a few days to 1 week by e-mail or mail. We value your feedback.  Saralyn Pilar, DO [facility], [place]

## 2020-03-09 NOTE — Progress Notes (Signed)
Subjective:    Patient ID: Kathleen Greene, female    DOB: [birth year], 39 y.o.   MRN: [id number]  Kathleen Greene is a 39 y.o. female presenting on 03/09/2020 for [place] (HA but has neuro appt next week, asthma)  Moved to [place] in December 2020, from [place].  HPI   Asthma, mild persistent / Seasonal/Environmental Allergies Chronic history of some allergies, worse seasonal. Has asthma issue with episodic flares, otherwise doing well. She had been on Singulair in past, then came off, now requesting re order on generic. - Has albuterol PRN inhaler rescue, but not needing regularly. Request re order  Dizziness / Lightheadedness / Headache Last few months, has had issues with dizziness and lightheadedness, worse with certain stores with lights and triggers, she would feel a "disconnected" lightheaded feeling, seems episodic, not always provoked, some anxiety component, she ended up going to Urgent Care x 3 in past month. She has had lab work and CXR and EKG and CT Head. - Only abnormal lab was mild low alk phos, she increased electrolytes - Also admits some anxiety related to these symptoms as well. - Could not identify a cause, she was referred to Neurology Dr Sanjuana Mae some photophobia, she admits always having issues with fluorescent lighting in stores. Admits some anxiety related Symptoms are usually not bothering her if sitting and at rest, usually provoked by movement as well. Not able to do as much exercise regularly due to some weakness in muscles. Some improvement with strengthening and exercise. Admits never has taken anxiety medication.  She has been taking Magnesium - Taking some "Natural Calm" on [place], about 370m per dose - Also taking Electrolyte mix drink, without sugar  Anxiety Reports chronic condition underlying, recently with above mentioned health concerns has bothered her more, never taken med, no other mental health problem or depression. She is able to  manage  Chronic Constipation Previous PCP [facility], Dr VPatrecia Pour and had seen [facility],  In past she was [place]. Prior dx IBS-C Now improved on Magnesium Tried Metamucil fiber supplement, without significant improvement. Tried Docusate in past, limited results.   Works as aEngineer, mining vProduct/process development scientistwork commission, EOrson EvaHusband works from home 1 daughter KEasleyMaintenance:  Cervical Cancer Screening: [place] as [place], says prior normal, she will be due for next pap smear in 2022 at her physical.  Breast CA Screening: Age 89 not due, she will pursue this at age 89+next physical will need order. No fam history  No early fam history of Colon CA  Not interested in COVID19 vaccine. She had COVID19 infection 06/2019, her father passed at that time. Father had idiopathic pulmonary fibrosis, he was smoker. took a toll on her, some bereavement circumstantial depression.  Due for Flu Shot, declines today despite counseling on benefits   [facility] 03/09/2020  Decreased Interest 0  Down, Depressed, Hopeless 0  PHQ - 2 Score 0   No flowsheet data found.    Past Medical History:  Diagnosis Date  . Asthma   . COVID-19 06/2019   No past surgical history on file. Social History   Socioeconomic History  . Marital status: Married    Spouse name: Not on file  . Number of children: 1  . Years of education: college  . Highest education level: Not on file  Occupational History  . Occupation: GRisk analyst  Tobacco Use  . Smoking status: Never Smoker  . Smokeless tobacco: Never Used  Vaping Use  . Vaping Use: Never used  Substance and Sexual Activity  . Alcohol use: Yes    Alcohol/week: 1.0 standard drink    Types: 1 Standard drinks or equivalent per week  . Drug use: Never  . Sexual activity: Not on file  Other Topics Concern  . Not  on file  Social History Narrative  . Not on file   Social Determinants of Health   Financial Resource Strain:   . Difficulty of Paying Living Expenses: Not on file  Food Insecurity:   . Worried About Charity fundraiser in the Last Year: Not on file  . Ran Out of Food in the Last Year: Not on file  Transportation Needs:   . Lack of Transportation (Medical): Not on file  . Lack of Transportation (Non-Medical): Not on file  Physical Activity:   . Days of Exercise per Week: Not on file  . Minutes of Exercise per Session: Not on file  Stress:   . Feeling of Stress : Not on file  Social Connections:   . Frequency of Communication with Friends and Family: Not on file  . Frequency of Social Gatherings with Friends and Family: Not on file  . Attends Religious Services: Not on file  . Active Member of Clubs or Organizations: Not on file  . Attends Archivist Meetings: Not on file  . Marital Status: Not on file  Intimate Partner Violence:   . Fear of Current or Ex-Partner: Not on file  . Emotionally Abused: Not on file  . Physically Abused: Not on file  . Sexually Abused: Not on file   Family History  Problem Relation Age of Onset  . Idiopathic pulmonary fibrosis Father    Current Outpatient Medications on File Prior to Visit  Medication Sig  . magnesium citrate SOLN Take by mouth.  . Spacer/Aero-Holding Chambers (AEROCHAMBER PLUS) inhaler Use as instructed   No current facility-administered medications on file prior to visit.    Review of Systems Per HPI unless specifically indicated above     Objective:    BP 129/83   Pulse 91   Temp 97.8 F (36.6 C) (Temporal)   Resp 16   Ht 5' 7"  (1.702 m)   Wt 132 lb 3.2 oz (60 kg)   SpO2 100%   BMI 20.71 kg/m   Wt Readings from Last 3 Encounters:  03/09/20 132 lb 3.2 oz (60 kg)  02/27/20 134 lb 14.7 oz (61.2 kg)  02/21/20 135 lb (61.2 kg)    Physical Exam Vitals and nursing note reviewed.  Constitutional:       General: She is not in acute distress.    Appearance: She is well-developed. She is not diaphoretic.     Comments: Well-appearing, comfortable, cooperative  HENT:     Head: Normocephalic and atraumatic.  Eyes:     General:        Right eye: No discharge.        Left eye: No discharge.     Conjunctiva/sclera: Conjunctivae normal.  Cardiovascular:     Rate and Rhythm: Normal rate.  Pulmonary:     Effort: Pulmonary effort is normal. No respiratory distress.     Breath sounds: Normal breath sounds. No wheezing, rhonchi or rales.  Skin:    General: Skin is warm and dry.     Findings: No erythema or rash.  Neurological:  Mental Status: She is alert and oriented to person, place, and time.  Psychiatric:        Behavior: Behavior normal.     Comments: Well groomed, good eye contact, normal speech and thoughts      CT head 02/27/20  CLINICAL DATA:  Dizziness, nonspecific. Additional history provided: Patient reports lightheadedness and feeling "woozy" for 5 weeks.  EXAM: CT HEAD WITHOUT AND WITH CONTRAST  TECHNIQUE: Contiguous axial images were obtained from the base of the skull through the vertex without and with intravenous contrast  CONTRAST:  34m OMNIPAQUE IOHEXOL 300 MG/ML  SOLN  COMPARISON:  No pertinent prior exams are available for comparison.  FINDINGS: Brain:  Cerebral volume is normal.  There is no acute intracranial hemorrhage.  No demarcated cortical infarct.  No extra-axial fluid collection.  No evidence of intracranial mass.  No midline shift.  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  No abnormal intracranial enhancement.  Vascular: No hyperdense vessel is demonstrated on pre-contrast imaging.  Skull: Normal. Negative for fracture or focal lesion.  Sinuses/Orbits: Visualized orbits show no acute finding. Minimal ethmoid sinus mucosal thickening. No  significant mastoid effusion.  IMPRESSION: No evidence of acute intracranial abnormality.  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 Otherwise unremarkable CT appearance of the brain.  Mild ethmoid sinus mucosal thickening.   Electronically Signed   By: KKellie SimmeringDO   On: 02/27/2020 16:51   Results for orders placed or performed during the hospital encounter of 02/27/20  Lyme disease, western blot  Result Value Ref Range     IgG P93 Ab. Absent      IgG P66 Ab. Absent      IgG P58 Ab. Absent      IgG P45 Ab. Absent      IgG P41 Ab. Absent      IgG P39 Ab. Absent      IgG P30 Ab. Absent      IgG P28 Ab. Absent      IgG P23 Ab. Absent      IgG P18 Ab. Absent    Lyme IgG Wb Negative      IgM P41 Ab. Absent      IgM P39 Ab. Absent      IgM P23 Ab. Absent    Lyme IgM Wb Negative   Thyroid antibodies  Result Value Ref Range   Thyroperoxidase Ab SerPl-aCnc 9 0 - 34 IU/mL   Thyroglobulin Antibody <1.0 0.0 - 0.9 IU/mL  Heavy metals, blood  Result Value Ref Range   Arsenic 1 (L) 2 - 23 ug/L   Mercury <1.0 0.0 - 14.9 ug/L   Lead <1 0 - 4 ug/dL      Assessment & Plan:   Problem List Items Addressed This Visit    Mild persistent asthma without complication - Primary    Stable, without exacerbation Chronic problem Secondary to allergies, other triggers Improved control on Singulair without maintenance inhaler Albuterol PRN flare only  Plan Re order Singulair generic 179mnightly Re order Albuterol PRN use only flares Encourage avoidance of triggers Follow-up if recurrent flares      Relevant Medications   montelukast (SINGULAIR) 10 MG tablet   albuterol (VENTOLIN HFA) 108 (90 Base) MCG/ACT inhaler   Environmental and seasonal allergies    Chronic seasonal Component affecting asthma Re order Singulair      Relevant Medications   montelukast (SINGULAIR) 10 MG tablet  Chronic idiopathic constipation     Improved Chronic problem On magnesium, trial of other meds in past Not taking miralax regularly, but agreeable to re-try more preventative dosing      Relevant Medications   magnesium citrate SOLN   Anxiety    Currently mild flare recently with constellation of symptoms see A&P Stressors loss of father Chronic problem Able to self manage without medication       Other Visit Diagnoses    Encounter to establish care with new doctor          #Request records from prior specialists [facility]YN [place]) / [facility])  #Dizziness/Lightheadedness / Headache Less likely BPPV based on lack of provoked symptoms or vertigo Has upcoming apt with [facility] on 9/27 Dr Melrose Nakayama Head CT results above, no clear cause of symptoms, some anatomical variant finding Reviewed prior urgent care notes, without clear cause of symptoms, lab results reviewed. Future consider other options such as anxiety component if remains unresolved  Meds ordered this encounter  Medications  . montelukast (SINGULAIR) 10 MG tablet    Sig: Take 1 tablet (10 mg total) by mouth at bedtime.    Dispense:  90 tablet    Refill:  3  . albuterol (VENTOLIN HFA) 108 (90 Base) MCG/ACT inhaler    Sig: Inhale 1-2 puffs into the lungs every 4 (four) hours as needed for wheezing or shortness of breath.    Dispense:  1 each    Refill:  2    Follow up plan: Return in about 1 year (around 03/09/2021) for Follow-up 1 year for Annual Physical + Pap (fasting lab AFTER apt).  Nobie Putnam, [facility] 03/09/2020, 3:45 PM

## 2020-03-09 NOTE — Assessment & Plan Note (Signed)
Chronic seasonal Component affecting asthma Re order Singulair

## 2020-03-09 NOTE — Assessment & Plan Note (Signed)
Stable, without exacerbation Chronic problem Secondary to allergies, other triggers Improved control on Singulair without maintenance inhaler Albuterol PRN flare only  Plan Re order Singulair generic 10mg  nightly Re order Albuterol PRN use only flares Encourage avoidance of triggers Follow-up if recurrent flares

## 2020-03-09 NOTE — Assessment & Plan Note (Signed)
Improved Chronic problem On magnesium, trial of other meds in past Not taking miralax regularly, but agreeable to re-try more preventative dosing

## 2020-03-09 NOTE — Assessment & Plan Note (Signed)
Currently mild flare recently with constellation of symptoms see A&P Stressors loss of father Chronic problem Able to self manage without medication

## 2020-07-01 ENCOUNTER — Encounter: Payer: Self-pay | Admitting: Family Medicine

## 2020-07-01 ENCOUNTER — Ambulatory Visit (INDEPENDENT_AMBULATORY_CARE_PROVIDER_SITE_OTHER): Payer: BC Managed Care – PPO | Admitting: Family Medicine

## 2020-07-01 ENCOUNTER — Other Ambulatory Visit: Payer: Self-pay

## 2020-07-01 VITALS — BP 146/94 | HR 114 | Ht 67.0 in | Wt 134.2 lb

## 2020-07-01 DIAGNOSIS — J3489 Other specified disorders of nose and nasal sinuses: Secondary | ICD-10-CM | POA: Diagnosis not present

## 2020-07-01 DIAGNOSIS — G43909 Migraine, unspecified, not intractable, without status migrainosus: Secondary | ICD-10-CM | POA: Diagnosis not present

## 2020-07-01 DIAGNOSIS — Z2821 Immunization not carried out because of patient refusal: Secondary | ICD-10-CM

## 2020-07-01 MED ORDER — SUMATRIPTAN SUCCINATE 50 MG PO TABS: 50.0000 mg | ORAL_TABLET | Freq: Once | ORAL | 2 refills | Status: AC | PRN

## 2020-07-01 NOTE — Progress Notes (Signed)
Subjective:    Patient ID: Larey Seat, female    DOB: 08/17/1980, 40 y.o.   MRN: [id number]  Kathleen Greene is a 40 y.o. female presenting on 07/01/2020 for Ear Pain and Headache   HPI   Dizziness / Lightheadedness / Headache - Last visit with me 03/09/20, for initial visit for same problem lightheaded dizziness, treated with Montelukast and referral to Neurology at [facility], see prior notes for background information. - Interval update with saw Dr Milana Huntsman Neuro on 03/16/20 - thought to be anxiety, was offered Lexapro did not start. She was doing very well with regards to anxiety had improved this overall through understanding her own fight or flight response and self guided cognitive behavioral therapy - Today patient reports now 1 week ago new onset symptoms return with sudden episodes and more frequent spells with increased intensity with feeling "woozy" and Lightheaded. Now again 2.5 days ago started getting headache. She was feeling tired and fatigued and worn out. She had episodes of fullness in her chin bilateral, and numbness in chin. - Now admits still persistent lethargy, lightheadedness, pressure in temples described as pulsing headache - Improved w/ Ibuprofen 200mg  x 3 and resting, laying down - No distinct sinus congestion or drainage - Admits some Left ear pain or pressure possibly radiating to that area Admits associated photophobia - No prior history of migraine headaches. Never on treatment - Prior CT 02/2020 after Emergency Department visit for Dizziness Denies fever chills bodyaches, change of test or smell, ear drainage  Health Maintenance: Unvaccinated against COVID or Flu, declines  Depression screen [facility] 2/9 07/01/2020 07/01/2020 03/09/2020  Decreased Interest 0 0 0  Down, Depressed, Hopeless 0 0 0  PHQ - 2 Score 0 0 0    Social History   Tobacco Use   Smoking status: Never Smoker   Smokeless tobacco: Never Used  Vaping Use   Vaping Use: Never used  Substance Use  Topics   Alcohol use: Yes    Alcohol/week: 1.0 standard drink    Types: 1 Standard drinks or equivalent per week   Drug use: Never    Review of Systems Per HPI unless specifically indicated above     Objective:    BP (!) 146/94    Pulse (!) 114    Ht 5\' 7"  (1.702 m)    Wt 134 lb 3.2 oz (60.9 kg)    SpO2 100%    BMI 21.02 kg/m   Wt Readings from Last 3 Encounters:  07/01/20 134 lb 3.2 oz (60.9 kg)  03/09/20 132 lb 3.2 oz (60 kg)  02/27/20 134 lb 14.7 oz (61.2 kg)    Physical Exam Vitals and nursing note reviewed.  Constitutional:      General: She is not in acute distress.    Appearance: She is well-developed and well-nourished. She is not diaphoretic.     Comments: Well-appearing, comfortable, cooperative  HENT:     Head: Normocephalic and atraumatic.     Right Ear: Tympanic membrane, ear canal and external ear normal. There is no impacted cerumen.     Left Ear: Ear canal and external ear normal. There is no impacted cerumen.     Ears:     Comments: Possible mild effusion clear fluid minimal L>R    Mouth/Throat:     Mouth: Oropharynx is clear and moist.  Eyes:     General:        Right eye: No discharge.        Left  eye: No discharge.     Conjunctiva/sclera: Conjunctivae normal.  Neck:     Thyroid: No thyromegaly.  Cardiovascular:     Rate and Rhythm: Normal rate and regular rhythm.     Pulses: Intact distal pulses.     Heart sounds: Normal heart sounds. No murmur heard.   Pulmonary:     Effort: Pulmonary effort is normal. No respiratory distress.     Breath sounds: Normal breath sounds. No wheezing or rales.  Musculoskeletal:        General: No edema. Normal range of motion.     Cervical back: Normal range of motion and neck supple.  Lymphadenopathy:     Cervical: No cervical adenopathy.  Skin:    General: Skin is warm and dry.     Findings: No erythema or rash.  Neurological:     Mental Status: She is alert and oriented to person, place, and time. Mental  status is at baseline.     Cranial Nerves: No cranial nerve deficit or facial asymmetry.     Sensory: No sensory deficit.     Motor: No weakness.     Coordination: Coordination normal.  Psychiatric:        Mood and Affect: Mood and affect normal.        Behavior: Behavior normal.     Comments: Well groomed, good eye contact, normal speech and thoughts      I have personally reviewed the radiology report from CT Head 02/27/20  CLINICAL DATA:  Dizziness, nonspecific. Additional history provided: Patient reports lightheadedness and feeling "woozy" for 5 weeks.  EXAM: CT HEAD WITHOUT AND WITH CONTRAST  TECHNIQUE: Contiguous axial images were obtained from the base of the skull through the vertex without and with intravenous contrast  CONTRAST:  51mL OMNIPAQUE IOHEXOL 300 MG/ML  SOLN  COMPARISON:  No pertinent prior exams are available for comparison.  FINDINGS: Brain:  Cerebral volume is normal.  There is no acute intracranial hemorrhage.  No demarcated cortical infarct.  No extra-axial fluid collection.  No evidence of intracranial mass.  No midline shift.  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  No abnormal intracranial enhancement.  Vascular: No hyperdense vessel is demonstrated on pre-contrast imaging.  Skull: Normal. Negative for fracture or focal lesion.  Sinuses/Orbits: Visualized orbits show no acute finding. Minimal ethmoid sinus mucosal thickening. No significant mastoid effusion.  IMPRESSION: No evidence of acute intracranial abnormality.  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 Otherwise unremarkable CT appearance of the brain.  Mild ethmoid sinus mucosal thickening.   Electronically Signed   By: Jackey Loge DO   On: 02/27/2020 16:51  Results for orders placed or performed during the hospital encounter  of 02/27/20  Lyme disease, western blot  Result Value Ref Range     IgG P93 Ab. Absent      IgG P66 Ab. Absent      IgG P58 Ab. Absent      IgG P45 Ab. Absent      IgG P41 Ab. Absent      IgG P39 Ab. Absent      IgG P30 Ab. Absent      IgG P28 Ab. Absent      IgG P23 Ab. Absent      IgG P18 Ab. Absent    Lyme IgG Wb Negative      IgM P41 Ab. Absent      IgM P39  Ab. Absent      IgM P23 Ab. Absent    Lyme IgM Wb Negative   Thyroid antibodies  Result Value Ref Range   Thyroperoxidase Ab SerPl-aCnc 9 0 - 34 IU/mL   Thyroglobulin Antibody <1.0 0.0 - 0.9 IU/mL  Heavy metals, blood  Result Value Ref Range   Arsenic 1 (L) 2 - 23 ug/L   Mercury <1.0 0.0 - 14.9 ug/L   Lead <1 0 - 4 ug/dL      Assessment & Plan:   Problem List Items Addressed This Visit    COVID-19 vaccination declined    Other Visit Diagnoses    Migraine without status migrainosus, not intractable, unspecified migraine type    -  Primary   Relevant Medications   SUMAtriptan (IMITREX) 50 MG tablet   Sinus pressure          Consistent with suspected migraine HA Prior possible similar onset last year, now has returned and worsening Consider Cluster Headaches given timeline Associated symptoms and overall syndrome characteristic of migraine headaches, likely with some neurological component with vestibular / dizziness Today without acute migraine or headache - Inadequately treated for migraine HA at home  Plan: 1. Start abortive therapy with Sumatriptan 50mg  tabs - take 1 PRN (#12, 0 refill due to quantity limit), severe HA, may repeat dose within 2 hr if persistent, no more in 24 hours, in future can titrate dose to 50-100mg  if needed. Counseling on potential side effect / intolerance with chest discomfort acutely after taking sumatriptan 2. Recommend taking Ibuprofen 600-800mg  q 8 hr PRN, and can try Tylenol 1000mg  TID alternatively 3. Avoid triggers including foods, caffeine. Important to rest. - Discussion  on future migraine prophylaxis medications - handout given, review options at next visit, determine need if using sumatriptan frequently 4. She may start headache diary, handout given, identify triggers for avoidance, bring to next visit  Return criteria given for acute migraine, when to go to office vs ED  Additionally she has already been evaluated by Dr at [facility] in past with original onset of symptoms. Discussed today that yes she may have an anxiety component as thought before, could explain elevated BP acutely with some white coat HTN here at doctors office, but do not think anxiety or BP are primary provoking factors for her symptoms. She may return to Neurology in future if needing further evaluation for headaches.  Recommend follow-up within 4 weeks to discuss progress or modify the treatment plan.   Meds ordered this encounter  Medications   SUMAtriptan (IMITREX) 50 MG tablet    Sig: Take 1 tablet (50 mg total) by mouth once as needed for up to 1 dose for migraine. May repeat one dose in 2 hours if headache persists, for max dose 24 hours    Dispense:  12 tablet    Refill:  2      Follow up plan: Return in about 4 weeks (around 07/29/2020) for 4 weeks headache / migraine follow-up.   [facility], DO [facility] 07/01/2020, 4:14 PM

## 2020-07-01 NOTE — Patient Instructions (Addendum)
Thank you for coming to the office today.  1. You most likely have Chronic Migraine Headaches - Migraine headaches present differently for many patients, pain is usually throbbing or aching, often on one side of head or behind the eye. They tend to last for up to hours or days. In treating migraines, our goal is to 1) stop the headache and 2) prevent recurrence of headaches  Treatment to STOP the headache at this time: - Start with Sumatriptan 50mg  - take 1 immediately at onset of moderate to severe migraine headache, if unresolved or return within 2 hours then repeat dose 1 tablet, that is max dose for 24 hours. (Note - this medication can cause a brief episode of flushing and chest pressure or pain very soon after taking it. That is NORMAL, and it is the medicine taking effect and dilating some blood vessels. It should pass, and resolve within seconds to minutes after - it may not happen at all)  - Try this for 1-2 weeks, if absolutely NOT helping then contact me to discuss changes, possibly can double sumatriptan dose or try nasal spray - You can still take over the counter meds with Ibuprofen up to 600-800mg  per dose 3 times a day with food for a few days and may try Excedrin Migraine as needed, ONLY use these after you have tried Sumatriptan up to 2 doses, and try to limit their use if they are not effective  Treatment to PREVENT headaches: - Goal is to avoid triggers. We need to learn more details on what are your exact or possible headache triggers first. - Keep detailed headache diary (on printed handout) for possible triggers, bring this to your next visit to discuss further - Known possible triggers include caffeine, chocolate, alcohol, stress, weather changes, menstrual cycle, certain other foods - Also be aware that OTC pain meds/anti-inflammatories can cause rebound headache, they help resolve the headache but then after the effect wears off they can CAUSE a headache. Try to taper down  and stop these medications and allow them to get out of your system for 1-2 weeks   Look into some of these alternatives for Migraine Headache Prophylaxis or preventative treatment  Antidepressant Type - Venlafaxine, Amitriptyline Blood pressure meds - Metoprolol, Propanolol, Verapamil Anti Headache/Seizure/Nerve medications - Topamax, Gabapentin  Future medication options that can be considered if persistent severe migraines and if failed some of the above prevention medicines include: - Aimovig, Emgality, Ajovy  Nurtec ODT is another very popular migraine medicine now as well. Can be used to prevent or stop migraines.  (these are once a month self injection medications that work directly to block migraine signals. They are relatively new, can be costly and have been proven to be very safe and effective. We can try to get them approved or for low cost if needed in future.)    Please schedule a Follow-up Appointment to: Return in about 4 weeks (around 07/29/2020) for 4 weeks headache / migraine follow-up.  If you have any other questions or concerns, please feel free to call the office or send a message through MyChart. You may also schedule an earlier appointment if necessary.  Additionally, you may be receiving a survey about your experience at our office within a few days to 1 week by e-mail or mail. We value your feedback.  09/26/2020, DO [facility], [facility]

## 2020-07-02 ENCOUNTER — Ambulatory Visit: Payer: BC Managed Care – PPO | Admitting: Family Medicine

## 2020-11-20 ENCOUNTER — Encounter: Payer: Self-pay | Admitting: Family Medicine

## 2020-11-20 ENCOUNTER — Ambulatory Visit (INDEPENDENT_AMBULATORY_CARE_PROVIDER_SITE_OTHER): Payer: BC Managed Care – PPO | Admitting: Family Medicine

## 2020-11-20 ENCOUNTER — Other Ambulatory Visit: Payer: Self-pay

## 2020-11-20 VITALS — BP 124/83 | HR 112 | Ht 67.0 in | Wt 135.6 lb

## 2020-11-20 DIAGNOSIS — F321 Major depressive disorder, single episode, moderate: Secondary | ICD-10-CM

## 2020-11-20 DIAGNOSIS — G8929 Other chronic pain: Secondary | ICD-10-CM

## 2020-11-20 DIAGNOSIS — M6281 Muscle weakness (generalized): Secondary | ICD-10-CM

## 2020-11-20 DIAGNOSIS — F419 Anxiety disorder, unspecified: Secondary | ICD-10-CM | POA: Diagnosis not present

## 2020-11-20 DIAGNOSIS — M2550 Pain in unspecified joint: Secondary | ICD-10-CM | POA: Diagnosis not present

## 2020-11-20 MED ORDER — ESCITALOPRAM OXALATE 10 MG PO TABS: 10.0000 mg | ORAL_TABLET | Freq: Every day | ORAL | 3 refills | Status: DC

## 2020-11-20 NOTE — Progress Notes (Signed)
Subjective:    Patient ID: Kathleen Greene, female    DOB: 1980/12/15, 40 y.o.   MRN: [id number]  Kathleen Greene is a 40 y.o. female presenting on 11/20/2020 for Fatigue, Dizziness, and Anxiety   HPI   Anxiety mixed depression Fatigue Constellation of muscle weakness / aches and joint pain, chronic  Follow-up previous concerns:  She had history of jaw clenching with anxiety, causing tension headaches. Has improved.  History of COVID 1.5 years ago. Believes most of these symptoms onset since then.  She was seen by [facility] 02/2020, and saw Dr Melrose Nakayama, and they ordered Escitalopram 49m in past but she did not take it. Her mother takes it and does well. - she has tried multiple supplements  She describes "fatigue" throughout her entire body, not just her legs. She says that this symptom is  She sleeps well but does not feel rested when gets up. She feels like legs and arms are heavy and arms are just "noodles". - Post-exertional malaise, very common problem since COVID. She has not been able to return to regular function since that time.  Also lost her father, passed away 203-12-2019 Has taken a major toll on her mentally.   Reports chronic condition underlying, recently with above mentioned health concerns has bothered her more, never taken med, no other mental health problem or depression. She is able to manage  Lichen Planus, oral Stress with father passed. Triggered this. She follows w/   She has history of possible Celiac Disease, and she game off of gluten. Admits propensity for autoimmune problem.  Psoriasis on abdomen at times.   [facility] 11/20/2020 07/01/2020 07/01/2020  Decreased Interest 2 0 0  Down, Depressed, Hopeless 2 0 0  PHQ - 2 Score 4 0 0  Altered sleeping 2 - -  Tired, decreased energy 3 - -  Change in appetite 0 - -  Feeling bad or failure about yourself  1 - -  Trouble concentrating 0 - -  Moving slowly or fidgety/restless 3 - -  Suicidal thoughts  0 - -  PHQ-9 Score 13 - -  Difficult doing work/chores Very difficult - -   GAD 7 : Generalized Anxiety Score 11/20/2020  Nervous, Anxious, on Edge 2  Control/stop worrying 2  Worry too much - different things 2  Trouble relaxing 2  Restless 0  Easily annoyed or irritable 2  Afraid - awful might happen 2  Total GAD 7 Score 12  Anxiety Difficulty Very difficult      Social History   Tobacco Use  . Smoking status: Never Smoker  . Smokeless tobacco: Never Used  Vaping Use  . Vaping Use: Never used  Substance Use Topics  . Alcohol use: Yes    Alcohol/week: 1.0 standard drink    Types: 1 Standard drinks or equivalent per week  . Drug use: Never    Review of Systems Per HPI unless specifically indicated above     Objective:    BP 124/83   Pulse (!) 112   Ht 5' 7"  (1.702 m)   Wt 135 lb 9.6 oz (61.5 kg)   SpO2 100%   BMI 21.24 kg/m   Wt Readings from Last 3 Encounters:  11/20/20 135 lb 9.6 oz (61.5 kg)  07/01/20 134 lb 3.2 oz (60.9 kg)  03/09/20 132 lb 3.2 oz (60 kg)    Physical Exam Vitals and nursing note reviewed.  Constitutional:      General: She is not  in acute distress.    Appearance: She is well-developed. She is not diaphoretic.     Comments: Well-appearing, comfortable, cooperative  HENT:     Head: Normocephalic and atraumatic.  Eyes:     General:        Right eye: No discharge.        Left eye: No discharge.     Conjunctiva/sclera: Conjunctivae normal.  Cardiovascular:     Rate and Rhythm: Normal rate.  Pulmonary:     Effort: Pulmonary effort is normal.  Skin:    General: Skin is warm and dry.     Findings: No erythema or rash.  Neurological:     Mental Status: She is alert and oriented to person, place, and time.  Psychiatric:        Behavior: Behavior normal.     Comments: Well groomed, good eye contact, normal speech and thoughts    Results for orders placed or performed during the hospital encounter of 02/27/20  Lyme disease, western  blot  Result Value Ref Range     IgG P93 Ab. Absent      IgG P66 Ab. Absent      IgG P58 Ab. Absent      IgG P45 Ab. Absent      IgG P41 Ab. Absent      IgG P39 Ab. Absent      IgG P30 Ab. Absent      IgG P28 Ab. Absent      IgG P23 Ab. Absent      IgG P18 Ab. Absent    Lyme IgG Wb Negative      IgM P41 Ab. Absent      IgM P39 Ab. Absent      IgM P23 Ab. Absent    Lyme IgM Wb Negative   Thyroid antibodies  Result Value Ref Range   Thyroperoxidase Ab SerPl-aCnc 9 0 - 34 IU/mL   Thyroglobulin Antibody <1.0 0.0 - 0.9 IU/mL  Heavy metals, blood  Result Value Ref Range   Arsenic 1 (L) 2 - 23 ug/L   Mercury <1.0 0.0 - 14.9 ug/L   Lead <1 0 - 4 ug/dL      Assessment & Plan:   Problem List Items Addressed This Visit    Anxiety - Primary   Relevant Medications   escitalopram (LEXAPRO) 10 MG tablet    Other Visit Diagnoses    Generalized muscle weakness       Relevant Orders   Sed Rate (ESR)   C-reactive protein   Cyclic citrul peptide antibody, IgG   Rheumatoid Factor   ANA   CBC with Differential/Platelet   COMPLETE METABOLIC PANEL WITH GFR   Chronic joint pain       Relevant Medications   escitalopram (LEXAPRO) 10 MG tablet   Other Relevant Orders   Sed Rate (ESR)   C-reactive protein   Cyclic citrul peptide antibody, IgG   Rheumatoid Factor   ANA   CBC with Differential/Platelet   COMPLETE METABOLIC PANEL WITH GFR   Current moderate episode of major depressive disorder without prior episode (HCC)       Relevant Medications   escitalopram (LEXAPRO) 10 MG tablet      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 Depression seems secondary to the anxiety and stress No prior medication management Has tried self  management techniques and supplements  Trial on SSRI Escitalopram 83m daily, counseling  provided, may take few weeks, can adjust dose if indicated. May help assist with her overall symptoms if some anxiety provoked. Can help with rest/sleep as well  Additional concern for constellation of chronic symptoms Chronic joint pain multiple areas, muscle aches and fatigue Question if Post COVID Chronic symptoms or if Chronic Fatigue Syndrome  Also with history of autoimmune concerns, history of lichen planus and possible psoriasis lesions  Will pursue broad lab testing with inflammatory autoimmune panel today ESR CRP RF ANA CCP and chemistry cbc  If identify abnormality or if negative and still suspicion for rheumatological vs autoimmune condition may refer to Rheumatology in near future   Orders Placed This Encounter  Procedures  . Sed Rate (ESR)  . C-reactive protein  . Cyclic citrul peptide antibody, IgG  . Rheumatoid Factor  . ANA  . CBC with Differential/Platelet  . COMPLETE METABOLIC PANEL WITH GFR     Meds ordered this encounter  Medications  . escitalopram (LEXAPRO) 10 MG tablet    Sig: Take 1 tablet (10 mg total) by mouth daily.    Dispense:  30 tablet    Refill:  3     Follow up plan: Return in about 4 weeks (around 12/18/2020) for 4 week follow-up anxiety / rheumatology lab updates/referral?.   ANobie Putnam [facility] 11/20/2020, 11:21 AM

## 2020-11-20 NOTE — Patient Instructions (Addendum)
Thank you for coming to the office today.  Labs today for autoimmune screening. Follow results, we may refer regardless.  Start the Escitalopram lexapro 10mg  daily, may take 2-4 weeks  Check in 4-6 weeks for updates, sooner if need for referral or other issues.   RHEUMATOLOGY  [facility]. DEGERFORS, MD Rheumatology [facility] [address] [place], [place], [place], [place]  [phone]  [facility] [address] [place], [place] Hours: M-Th 8-5pm / F 8-12noon Phone: [phone]  [facility] [address], [address] [place], [place] Phone: [phone]   Please schedule a Follow-up Appointment to: Return in about 4 weeks (around 12/18/2020) for 4 week follow-up anxiety / rheumatology lab updates/referral?.  If you have any other questions or concerns, please feel free to call the office or send a message through MyChart. You may also schedule an earlier appointment if necessary.  Additionally, you may be receiving a survey about your experience at our office within a few days to 1 week by e-mail or mail. We value your feedback.  02/18/2021, DO [facility], [facility]

## 2020-11-23 LAB — COMPLETE METABOLIC PANEL WITH GFR
AG Ratio: 1.9 (calc) (ref 1.0–2.5)
ALT: 11 U/L (ref 6–29)
AST: 14 U/L (ref 10–30)
Albumin: 5 g/dL (ref 3.6–5.1)
Alkaline phosphatase (APISO): 36 U/L (ref 31–125)
BUN: 10 mg/dL (ref 7–25)
CO2: 28 mmol/L (ref 20–32)
Calcium: 9.9 mg/dL (ref 8.6–10.2)
Chloride: 103 mmol/L (ref 98–110)
Creat: 0.71 mg/dL (ref 0.50–1.10)
GFR, Est African American: 124 mL/min/{1.73_m2} (ref >=60)
GFR, Est Non African American: 107 mL/min/{1.73_m2} (ref >=60)
Globulin: 2.7 g/dL (calc) (ref 1.9–3.7)
Glucose, Bld: 99 mg/dL (ref 65–99)
Potassium: 3.8 mmol/L (ref 3.5–5.3)
Sodium: 140 mmol/L (ref 135–146)
Total Bilirubin: 0.6 mg/dL (ref 0.2–1.2)
Total Protein: 7.7 g/dL (ref 6.1–8.1)

## 2020-11-23 LAB — CBC WITH DIFFERENTIAL/PLATELET
Absolute Monocytes: 563 cells/uL (ref 200–950)
Basophils Absolute: 17 cells/uL (ref 0–200)
Basophils Relative: 0.2 %
Eosinophils Absolute: 59 cells/uL (ref 15–500)
Eosinophils Relative: 0.7 %
HCT: 43 % (ref 35.0–45.0)
Hemoglobin: 14.8 g/dL (ref 11.7–15.5)
Lymphs Abs: 1336 cells/uL (ref 850–3900)
MCH: 32.2 pg (ref 27.0–33.0)
MCHC: 34.4 g/dL (ref 32.0–36.0)
MCV: 93.5 fL (ref 80.0–100.0)
MPV: 11.1 fL (ref 7.5–12.5)
Monocytes Relative: 6.7 %
Neutro Abs: 6426 cells/uL (ref 1500–7800)
Neutrophils Relative %: 76.5 %
Platelets: 206 10*3/uL (ref 140–400)
RBC: 4.6 10*6/uL (ref 3.80–5.10)
RDW: 12.3 % (ref 11.0–15.0)
Total Lymphocyte: 15.9 %
WBC: 8.4 10*3/uL (ref 3.8–10.8)

## 2020-11-23 LAB — C-REACTIVE PROTEIN: CRP: 0.4 mg/L (ref <8.0)

## 2020-11-23 LAB — SEDIMENTATION RATE: Sed Rate: 2 mm/h (ref 0–20)

## 2020-11-23 LAB — CYCLIC CITRUL PEPTIDE ANTIBODY, IGG: Cyclic Citrullin Peptide Ab: <16 UNITS

## 2020-11-23 LAB — RHEUMATOID FACTOR: Rheumatoid fact SerPl-aCnc: <14 IU/mL (ref <14)

## 2020-11-23 LAB — ANA: Anti Nuclear Antibody (ANA): NEGATIVE

## 2020-12-12 ENCOUNTER — Other Ambulatory Visit: Payer: Self-pay | Admitting: Family Medicine

## 2020-12-12 DIAGNOSIS — F419 Anxiety disorder, unspecified: Secondary | ICD-10-CM

## 2020-12-12 NOTE — Telephone Encounter (Signed)
Last RF 11/20/20 #30 3 RF-- 90 day RF - pt due for refill

## 2021-02-27 ENCOUNTER — Other Ambulatory Visit: Payer: Self-pay | Admitting: Family Medicine

## 2021-02-27 DIAGNOSIS — J3089 Other allergic rhinitis: Secondary | ICD-10-CM

## 2021-02-27 DIAGNOSIS — F419 Anxiety disorder, unspecified: Secondary | ICD-10-CM

## 2021-02-27 DIAGNOSIS — J4530 Mild persistent asthma, uncomplicated: Secondary | ICD-10-CM

## 2021-02-27 NOTE — Telephone Encounter (Signed)
Requested Prescriptions  Pending Prescriptions Disp Refills  . montelukast (SINGULAIR) 10 MG tablet [Pharmacy Med Name: MONTELUKAST SOD 10 MG TABLET] 90 tablet 3    Sig: TAKE 1 TABLET BY MOUTH EVERYDAY AT BEDTIME     Pulmonology:  Leukotriene Inhibitors Passed - 02/27/2021  9:38 AM      Passed - Valid encounter within last 12 months    Recent Outpatient Visits          3 months ago Anxiety   [facility] [place], Netta Neat, DO   8 months ago Migraine without status migrainosus, not intractable, unspecified migraine type   [facility] Smitty Cords, DO   11 months ago Mild persistent asthma without complication   [facility], Netta Neat, DO             . escitalopram (LEXAPRO) 10 MG tablet [Pharmacy Med Name: ESCITALOPRAM 10 MG TABLET] 90 tablet 0    Sig: TAKE 1 TABLET BY MOUTH EVERY DAY     Psychiatry:  Antidepressants - SSRI Passed - 02/27/2021  9:38 AM      Passed - Valid encounter within last 6 months    Recent Outpatient Visits          3 months ago Anxiety   [facility], Netta Neat, DO   8 months ago Migraine without status migrainosus, not intractable, unspecified migraine type   [facility] Smitty Cords, DO   11 months ago Mild persistent asthma without complication   [facility] [place], Netta Neat, DO

## 2021-06-21 ENCOUNTER — Other Ambulatory Visit: Payer: Self-pay | Admitting: Family Medicine

## 2021-06-21 DIAGNOSIS — F419 Anxiety disorder, unspecified: Secondary | ICD-10-CM

## 2021-06-23 NOTE — Telephone Encounter (Signed)
Requested Prescriptions  Pending Prescriptions Disp Refills   escitalopram (LEXAPRO) 10 MG tablet [Pharmacy Med Name: ESCITALOPRAM 10 MG TABLET] 90 tablet 0    Sig: TAKE 1 TABLET BY MOUTH EVERY DAY     Psychiatry:  Antidepressants - SSRI Failed - 06/21/2021  9:49 PM      Failed - Valid encounter within last 6 months    Recent Outpatient Visits          7 months ago Anxiety   [facility] [place], Netta Neat, DO   11 months ago Migraine without status migrainosus, not intractable, unspecified migraine type   [facility] [place], Netta Neat, DO   1 year ago Mild persistent asthma without complication   [facility] Althea Charon, Netta Neat, DO      Future Appointments            In 2 days Althea Charon, Netta Neat, DO [facility]

## 2021-06-25 ENCOUNTER — Ambulatory Visit: Payer: BC Managed Care – PPO | Admitting: Family Medicine

## 2021-07-27 ENCOUNTER — Ambulatory Visit
Admission: RE | Admit: 2021-07-27 | Discharge: 2021-07-27 | Disposition: A | Discharge status: Home / Self Care | Payer: BC Managed Care – PPO | Attending: Family Medicine | Admitting: Family Medicine

## 2021-07-27 ENCOUNTER — Ambulatory Visit (INDEPENDENT_AMBULATORY_CARE_PROVIDER_SITE_OTHER): Payer: BC Managed Care – PPO | Admitting: Family Medicine

## 2021-07-27 ENCOUNTER — Encounter: Payer: Self-pay | Admitting: Family Medicine

## 2021-07-27 ENCOUNTER — Ambulatory Visit
Admission: RE | Admit: 2021-07-27 | Discharge: 2021-07-27 | Disposition: A | Discharge status: Home / Self Care | Payer: BC Managed Care – PPO | Source: Ambulatory Visit | Attending: Family Medicine | Admitting: Family Medicine

## 2021-07-27 ENCOUNTER — Other Ambulatory Visit: Payer: Self-pay

## 2021-07-27 VITALS — BP 125/79 | HR 130 | Temp 98.6°F | Ht 67.0 in | Wt 135.0 lb

## 2021-07-27 DIAGNOSIS — G8929 Other chronic pain: Secondary | ICD-10-CM

## 2021-07-27 DIAGNOSIS — M545 Low back pain, unspecified: Secondary | ICD-10-CM | POA: Diagnosis present

## 2021-07-27 MED ORDER — BACLOFEN 10 MG PO TABS: 5.0000 mg | ORAL_TABLET | Freq: Three times a day (TID) | ORAL | 2 refills | Status: AC | PRN

## 2021-07-27 NOTE — Patient Instructions (Addendum)
Thank you for coming to the office today.  1. For your Back Pain - I think that this is due to Muscle Spasms or strain.   X-rays Today, stay tuned for results. Can put comments on mychart.  Trial on new muscle relaxant Start Baclofen (Lioresal)  10mg  tablets - cut in half for 5mg  at night for muscle relaxant - may make you sedated or sleepy (be careful driving or working on this) if tolerated you can take every 8 hours, half or whole tab  START anti inflammatory topical - OTC Voltaren (generic Diclofenac) topical 2-4 times a day as needed for pain swelling of affected joint for 1-2 weeks or longer.  If needed can amp up your anti inflammatories can do Ibuprofen 600mg  3 times a day for short term (1 week or less), OR Aleve x 2 pills OTC twice a day with meal for 1 week or less  May use Tylenol Extra Str 500mg  tabs - may take 1-2 tablets every 6 hours as needed  Recommend to start using heating pad on your lower back 1-2x daily for few weeks  Also try a Wedge Seat Cushion to avoid nerve pinching when sitting prolonged period of time.  This pain may take weeks to months to fully resolve, but hopefully it will respond to the medicine initially. All back injuries (small or serious) are slow to heal since we use our back muscles every day. Be careful with turning, twisting, lifting, sitting / standing for prolonged periods, and avoid re-injury.  If your symptoms significantly worsen with more pain, or new symptoms with weakness in one or both legs, new or different shooting leg pains, numbness in legs or groin, loss of control or retention of urine or bowel movements, please call back for advice and you may need to go directly to the Emergency Department.  Please schedule a Follow-up Appointment to: Return in about 6 weeks (around 09/07/2021) for 6 weeks follow-up Back Pain.  If you have any other questions or concerns, please feel free to call the office or send a message through [place]. You may  also schedule an earlier appointment if necessary.  Additionally, you may be receiving a survey about your experience at our office within a few days to 1 week by e-mail or mail. We value your feedback.  Nobie Putnam, DO [place]

## 2021-07-27 NOTE — Progress Notes (Signed)
Subjective:    Patient ID: Kathleen Greene, female    DOB: 1980-08-17, 41 y.o.   MRN: [id number]  Kathleen Greene is a 41 y.o. female presenting on 07/27/2021 for Back Pain (Lower back, has been going on for 6 months/)   HPI  Chronic Low Back Pain SI region Reports since age 34s chronic problem, has been in bilateral Low Back and radiate to hips, sometimes only 1 side or other. Never had known trigger injury in past. She had mild symptoms years ago, episodic only, nothing sustained.  Now, reports symptoms started about 6 month ago without inciting injury known. Today seems to be worsening gradually vs persistent. Describes pain as episodic some days worse than others, can have painful episodes some days worse than others usually sore and stiff when wake up in morning, she lacks strength and mobility, and as she is more active she will feel better, and if too much activity and stressors it can cause worse pain. Describes pain severity as mostly moderate at most is severe pain split second if sneezing. She is worried about possibility of herniated disc. She does try to stay active and remain upright. Describes pain radiating to hips both sides Denies any pain radiating to legs. Not considered Sciatica. She may have some achy pain in legs but not having nerve or shooting pain in legs. Occasional taking OTC Ibuprofen 2 pills PRN not often Rare, infrequent Tylenol PRN Tries Tumeric Supplement PRN Tried Salonpas temporary relief. Some occasional relief from cold. She does improve from hot shower and heat as needed but not used often. No prior diagnosis of OA/DJD no prior imaging, no back surgery Followed by Chiropractor - previously they helped her neck and upper back and they tried lower back spine issue treatment without relief - Prior similar back pain flares improved with rest and medication OTC Not impacting her sleep - Denies any fevers/chills, numbness, tingling, weakness, loss of control bladder/bowel  incontinence or retention, unintentional wt loss, night sweats   Depression screen [facility] 2/9 07/27/2021 11/20/2020 07/01/2020  Decreased Interest 0 2 0  Down, Depressed, Hopeless 0 2 0  PHQ - 2 Score 0 4 0  Altered sleeping 0 2 -  Tired, decreased energy 0 3 -  Change in appetite 0 0 -  Feeling bad or failure about yourself  0 1 -  Trouble concentrating - 0 -  Moving slowly or fidgety/restless 0 3 -  Suicidal thoughts 0 0 -  PHQ-9 Score 0 13 -  Difficult doing work/chores Not difficult at all Very difficult -    Social History   Tobacco Use   Smoking status: Never   Smokeless tobacco: Never  Vaping Use   Vaping Use: Never used  Substance Use Topics   Alcohol use: Yes    Alcohol/week: 1.0 standard drink    Types: 1 Standard drinks or equivalent per week   Drug use: Never    Review of Systems Per HPI unless specifically indicated above     Objective:    BP 125/79 (BP Location: Left Arm, Patient Position: Sitting, Cuff Size: Normal)    Pulse (!) 130    Temp 98.6 F (37 C) (Temporal)    Wt 135 lb (61.2 kg)    SpO2 100%    BMI 21.14 kg/m   Wt Readings from Last 3 Encounters:  07/27/21 135 lb (61.2 kg)  11/20/20 135 lb 9.6 oz (61.5 kg)  07/01/20 134 lb 3.2 oz (60.9 kg)    Physical  Exam Vitals and nursing note reviewed.  Constitutional:      General: She is not in acute distress.    Appearance: Normal appearance. She is well-developed. She is not diaphoretic.     Comments: Well-appearing, comfortable, cooperative  HENT:     Head: Normocephalic and atraumatic.  Eyes:     General:        Right eye: No discharge.        Left eye: No discharge.     Conjunctiva/sclera: Conjunctivae normal.  Cardiovascular:     Rate and Rhythm: Normal rate.  Pulmonary:     Effort: Pulmonary effort is normal.  Musculoskeletal:     Comments: Low Back Inspection: Normal appearance, thin body habitus, no spinal deformity, symmetrical. Palpation: No tenderness over spinous processes.  Bilateral lumbar paraspinal muscles non-tender and without hypertonicity/spasm. ROM: Full active ROM forward flex / back extension, rotation L/R without discomfort  - note provoked pain when bending up from leaning forward and position change on and off the table when getting up from seated position. Special Testing: Seated SLR negative for radicular pain bilaterally   Strength: Bilateral hip flex/ext 5/5, knee flex/ext 5/5, ankle dorsiflex/plantarflex 5/5 Neurovascular: intact distal sensation to light touch   Skin:    General: Skin is warm and dry.     Findings: No erythema or rash.  Neurological:     Mental Status: She is alert and oriented to person, place, and time.  Psychiatric:        Mood and Affect: Mood normal.        Behavior: Behavior normal.        Thought Content: Thought content normal.     Comments: Well groomed, good eye contact, normal speech and thoughts      Results for orders placed or performed in visit on 11/20/20  Sed Rate (ESR)  Result Value Ref Range   Sed Rate 2 0 - 20 mm/h  C-reactive protein  Result Value Ref Range   CRP 0.4 <6.9 mg/L  Cyclic citrul peptide antibody, IgG  Result Value Ref Range   Cyclic Citrullin Peptide Ab <16 UNITS  Rheumatoid Factor  Result Value Ref Range   Rhuematoid fact SerPl-aCnc <14 <14 IU/mL  ANA  Result Value Ref Range   Anti Nuclear Antibody (ANA) NEGATIVE NEGATIVE  CBC with Differential/Platelet  Result Value Ref Range   WBC 8.4 3.8 - 10.8 Thousand/uL   RBC 4.60 3.80 - 5.10 Million/uL   Hemoglobin 14.8 11.7 - 15.5 g/dL   HCT 43.0 35.0 - 45.0 %   MCV 93.5 80.0 - 100.0 fL   MCH 32.2 27.0 - 33.0 pg   MCHC 34.4 32.0 - 36.0 g/dL   RDW 12.3 11.0 - 15.0 %   Platelets 206 140 - 400 Thousand/uL   MPV 11.1 7.5 - 12.5 fL   Neutro Abs 6,426 1,500 - 7,800 cells/uL   Lymphs Abs 1,336 850 - 3,900 cells/uL   Absolute Monocytes 563 200 - 950 cells/uL   Eosinophils Absolute 59 15 - 500 cells/uL   Basophils Absolute 17 0 -  200 cells/uL   Neutrophils Relative % 76.5 %   Total Lymphocyte 15.9 %   Monocytes Relative 6.7 %   Eosinophils Relative 0.7 %   Basophils Relative 0.2 %  COMPLETE METABOLIC PANEL WITH GFR  Result Value Ref Range   Glucose, Bld 99 65 - 99 mg/dL   BUN 10 7 - 25 mg/dL   Creat 0.71 0.50 - 1.10 mg/dL  GFR, Est Non African American 107 > OR = 60 mL/min/1.82m   GFR, Est African American 124 > OR = 60 mL/min/1.713m  BUN/Creatinine Ratio NOT APPLICABLE 6 - 22 (calc)   Sodium 140 135 - 146 mmol/L   Potassium 3.8 3.5 - 5.3 mmol/L   Chloride 103 98 - 110 mmol/L   CO2 28 20 - 32 mmol/L   Calcium 9.9 8.6 - 10.2 mg/dL   Total Protein 7.7 6.1 - 8.1 g/dL   Albumin 5.0 3.6 - 5.1 g/dL   Globulin 2.7 1.9 - 3.7 g/dL (calc)   AG Ratio 1.9 1.0 - 2.5 (calc)   Total Bilirubin 0.6 0.2 - 1.2 mg/dL   Alkaline phosphatase (APISO) 36 31 - 125 U/L   AST 14 10 - 30 U/L   ALT 11 6 - 29 U/L      Assessment & Plan:   Problem List Items Addressed This Visit   None Visit Diagnoses     Chronic bilateral low back pain without sciatica    -  Primary   Relevant Medications   baclofen (LIORESAL) 10 MG tablet   Other Relevant Orders   DG Lumbar Spine Complete      Subacute on chronic bilateral LBP without sciatica. Likely underlying OA/DJD without acute etiology of injury No obvious deformity curvature or muscular dysfunction on exam Chronic symptoms for years, but now progressive worse Worse with forward bending and returning up to natural posture, or transition from sitting to standing. Position changes - No red flag symptoms. Negative SLR for radiculopathy - Inadequate conservative therapy   Plan: 1. X-ray today Lumbar spine 2. Recommend continued anti-inflammatory trial with rx Ibuprofen vs Voltaren topical 2. Start muscle relaxant with Baclofen 1036mabs - take 5-3m60m to TID PRN, titrate up as tolerated caution sedation 3. May use Tylenol PRN for breakthrough 4. Encouraged use of heating pad  1-2x daily for now then PRN 5.  Follow-up 4-6 weeks if not improved for re-evaluation - consider PT / Sports med vs Orthopedics   Orders Placed This Encounter  Procedures   DG Lumbar Spine Complete    Standing Status:   Future    Standing Expiration Date:   07/27/2022    Order Specific Question:   Reason for Exam (SYMPTOM  OR DIAGNOSIS REQUIRED)    Answer:   chronic >6 months worsening (actually for years) no injury, bilateral symptoms, no sciatica, eval arthritis    Order Specific Question:   Is patient pregnant?    Answer:   No    Order Specific Question:   Preferred imaging location?    Answer:   [facility] ordered this encounter  Medications   baclofen (LIORESAL) 10 MG tablet    Sig: Take 0.5-1 tablets (5-10 mg total) by mouth 3 (three) times daily as needed for muscle spasms.    Dispense:  30 each    Refill:  2     Follow up plan: Return in about 6 weeks (around 09/07/2021) for 6 weeks follow-up Back Pain.   AlexNobie Putnam [place] 07/27/2021, 2:51 PM

## 2021-09-18 ENCOUNTER — Other Ambulatory Visit: Payer: Self-pay | Admitting: Family Medicine

## 2021-09-18 DIAGNOSIS — F419 Anxiety disorder, unspecified: Secondary | ICD-10-CM

## 2021-09-20 NOTE — Telephone Encounter (Signed)
Requested medication (s) are due for refill today: yes ? ?Requested medication (s) are on the active medication list: yes ? ?Last refill:  06/23/21 #90 with 0 RF ? ?Future visit scheduled: no, this med/dx addressed at 11/20/20 OV, seen 07/27/21 for something else, no upcoming visit. ? ?Notes to clinic:  Please assess, pt was seen in office recently for pain. ? ? ?  ? ?Requested Prescriptions  ?Pending Prescriptions Disp Refills  ? escitalopram (LEXAPRO) 10 MG tablet [Pharmacy Med Name: ESCITALOPRAM 10 MG TABLET] 90 tablet 0  ?  Sig: TAKE 1 TABLET BY MOUTH EVERY DAY  ?  ? Psychiatry:  Antidepressants - SSRI Passed - 09/18/2021  3:03 PM  ?  ?  Passed - Valid encounter within last 6 months  ?  Recent Outpatient Visits   ? ?      ? 1 month ago Chronic bilateral low back pain without sciatica  ? [place], DO  ? 10 months ago Anxiety  ? [place], DO  ? 1 year ago Migraine without status migrainosus, not intractable, unspecified migraine type  ? [place], DO  ? 1 year ago Mild persistent asthma without complication  ? [place], DO  ? ?  ?  ? ?  ?  ?  ? ? ?

## 2021-12-23 ENCOUNTER — Other Ambulatory Visit: Payer: Self-pay | Admitting: Family Medicine

## 2021-12-23 DIAGNOSIS — J4530 Mild persistent asthma, uncomplicated: Secondary | ICD-10-CM

## 2021-12-24 NOTE — Telephone Encounter (Signed)
Requested medication (s) are due for refill today:   Yes  Requested medication (s) are on the active medication list:   Yes from 2021  Future visit scheduled:   No   Last ordered: 03/09/2020 1 each, 2 refills  Returned because rx from 2021.     Requested Prescriptions  Pending Prescriptions Disp Refills   albuterol (VENTOLIN HFA) 108 (90 Base) MCG/ACT inhaler [Pharmacy Med Name: ALBUTEROL HFA (PROVENTIL) INH] 6.7 each 2    Sig: Inhale 1-2 puffs into the lungs every 4 (four) hours as needed for wheezing or shortness of breath.     Pulmonology:  Beta Agonists 2 Failed - 12/23/2021  3:53 PM      Failed - Last Heart Rate in normal range    Pulse Readings from Last 1 Encounters:  07/27/21 (!) 130         Passed - Last BP in normal range    BP Readings from Last 1 Encounters:  07/27/21 125/79         Passed - Valid encounter within last 12 months    Recent Outpatient Visits           5 months ago Chronic bilateral low back pain without sciatica   [facility] [place], DO   1 year ago Anxiety   [facility] [place], Netta Neat, DO   1 year ago Migraine without status migrainosus, not intractable, unspecified migraine type   [facility] [place], Netta Neat, DO   1 year ago Mild persistent asthma without complication   [facility] [place], Netta Neat, DO

## 2022-03-22 ENCOUNTER — Other Ambulatory Visit: Payer: Self-pay | Admitting: Family Medicine

## 2022-03-22 DIAGNOSIS — J4530 Mild persistent asthma, uncomplicated: Secondary | ICD-10-CM

## 2022-03-22 DIAGNOSIS — J3089 Other allergic rhinitis: Secondary | ICD-10-CM

## 2022-03-22 NOTE — Telephone Encounter (Signed)
Requested Prescriptions  Pending Prescriptions Disp Refills  . montelukast (SINGULAIR) 10 MG tablet [Pharmacy Med Name: MONTELUKAST SOD 10 MG TABLET] 90 tablet 1    Sig: TAKE 1 TABLET BY MOUTH EVERYDAY AT BEDTIME     Pulmonology:  Leukotriene Inhibitors Passed - 03/22/2022  2:20 AM      Passed - Valid encounter within last 12 months    Recent Outpatient Visits          7 months ago Chronic bilateral low back pain without sciatica   [place], DO   1 year ago [place], DO   1 year ago Migraine without status migrainosus, not intractable, unspecified migraine type   [place], DO   2 years ago Mild persistent asthma without complication   [place], DO

## 2022-09-21 ENCOUNTER — Other Ambulatory Visit: Payer: Self-pay | Admitting: Family Medicine

## 2022-09-21 DIAGNOSIS — F419 Anxiety disorder, unspecified: Secondary | ICD-10-CM

## 2022-09-21 NOTE — Telephone Encounter (Signed)
Unable to refill per protocol, appointment needed.   Requested Prescriptions  Pending Prescriptions Disp Refills   escitalopram (LEXAPRO) 10 MG tablet [Pharmacy Med Name: ESCITALOPRAM 10 MG TABLET] 90 tablet 3    Sig: TAKE 1 TABLET BY MOUTH EVERY DAY     Psychiatry:  Antidepressants - SSRI Failed - 09/21/2022  2:16 AM      Failed - Valid encounter within last 6 months    Recent Outpatient Visits           1 year ago Chronic bilateral low back pain without sciatica   [place], DO   1 year ago [place], DO   2 years ago Migraine without status migrainosus, not intractable, unspecified migraine type   [place], DO   2 years ago Mild persistent asthma without complication   [place], [place]

## 2023-05-26 IMAGING — DX DG LUMBAR SPINE COMPLETE 4+V
5 series · 5 of 5 positions shown · non-contrast
Comparison: None

CLINICAL DATA: Bilateral lower back pain x 6 months with radiation
into the hips at times. Patient notes pain is worse with bending. No
known injury, initial encounter.

EXAM:
LUMBAR SPINE - COMPLETE 4+ VIEW

[l-spine ap]
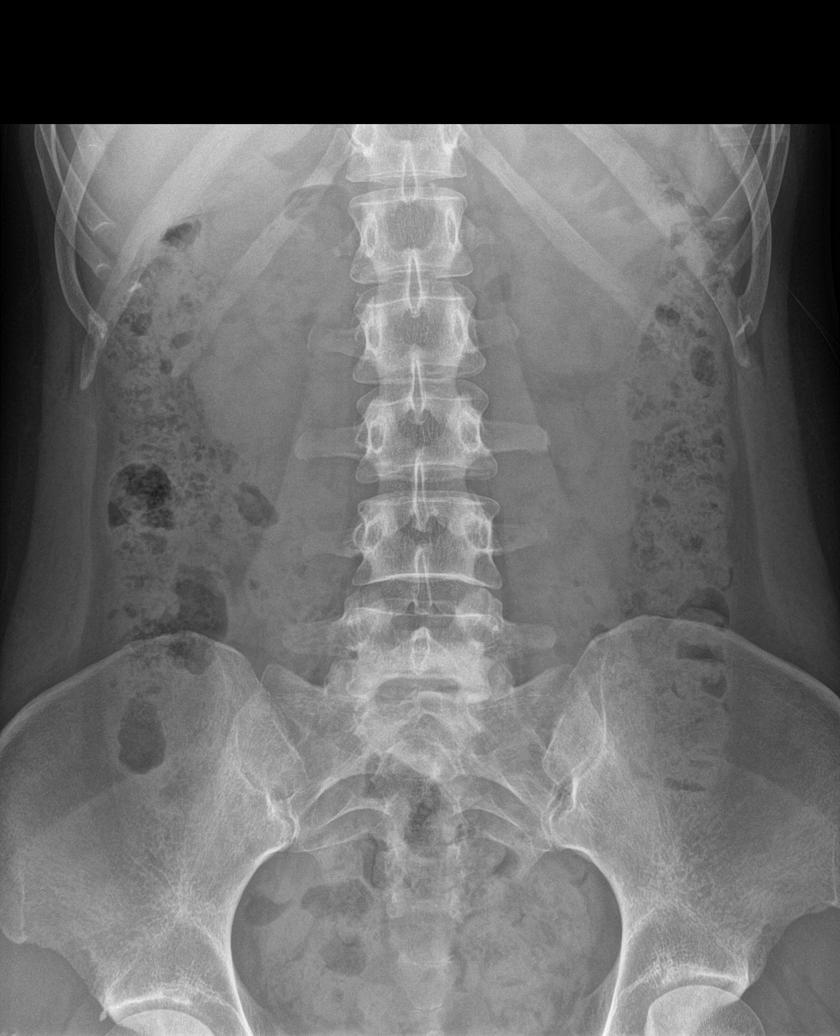

[l-spine obl (1 of 2)]
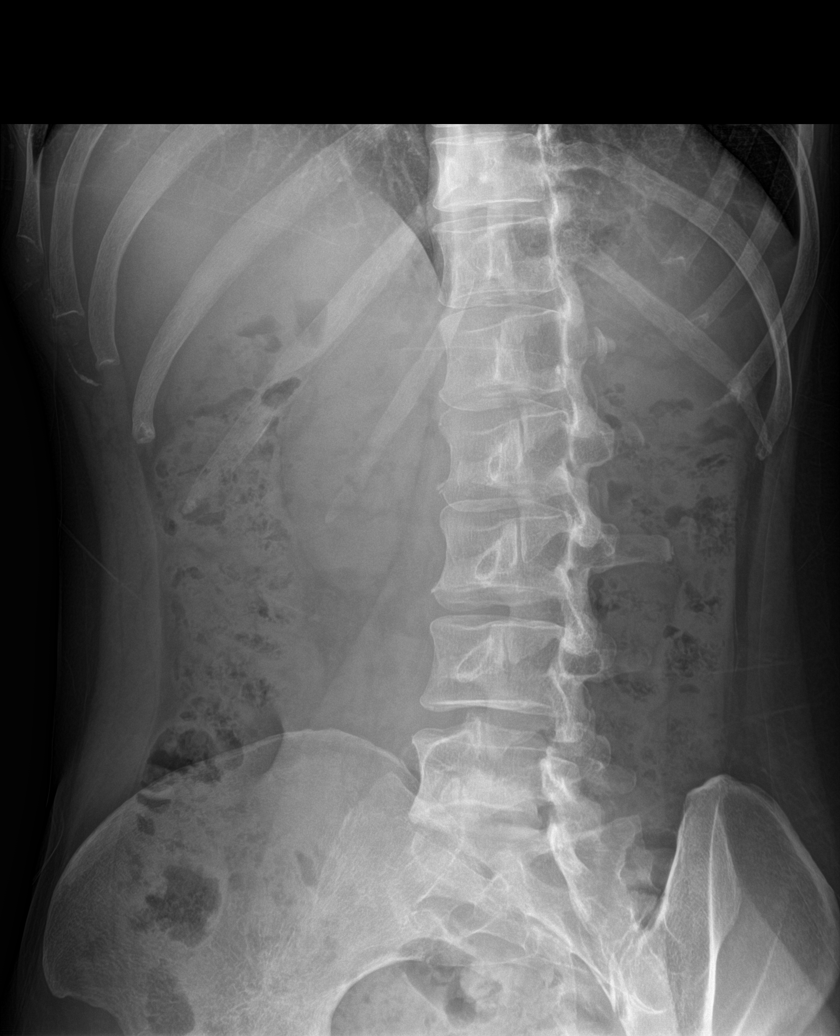

[l-spine obl (2 of 2)]
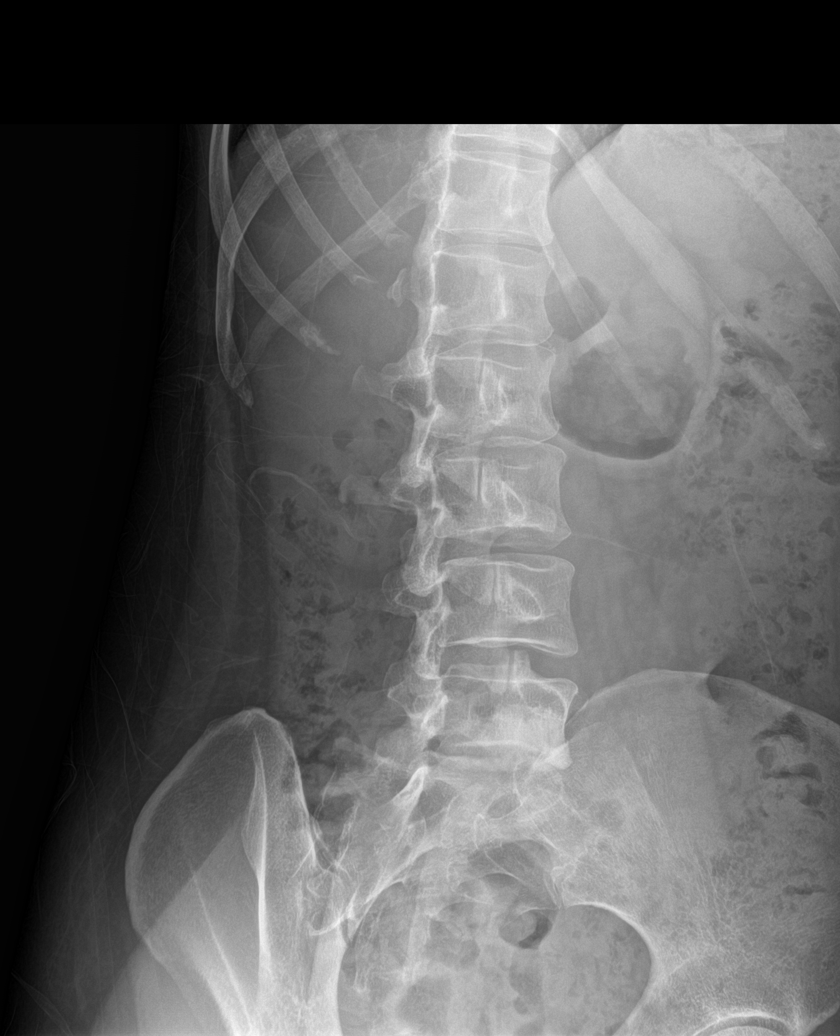

[l-spine lat]
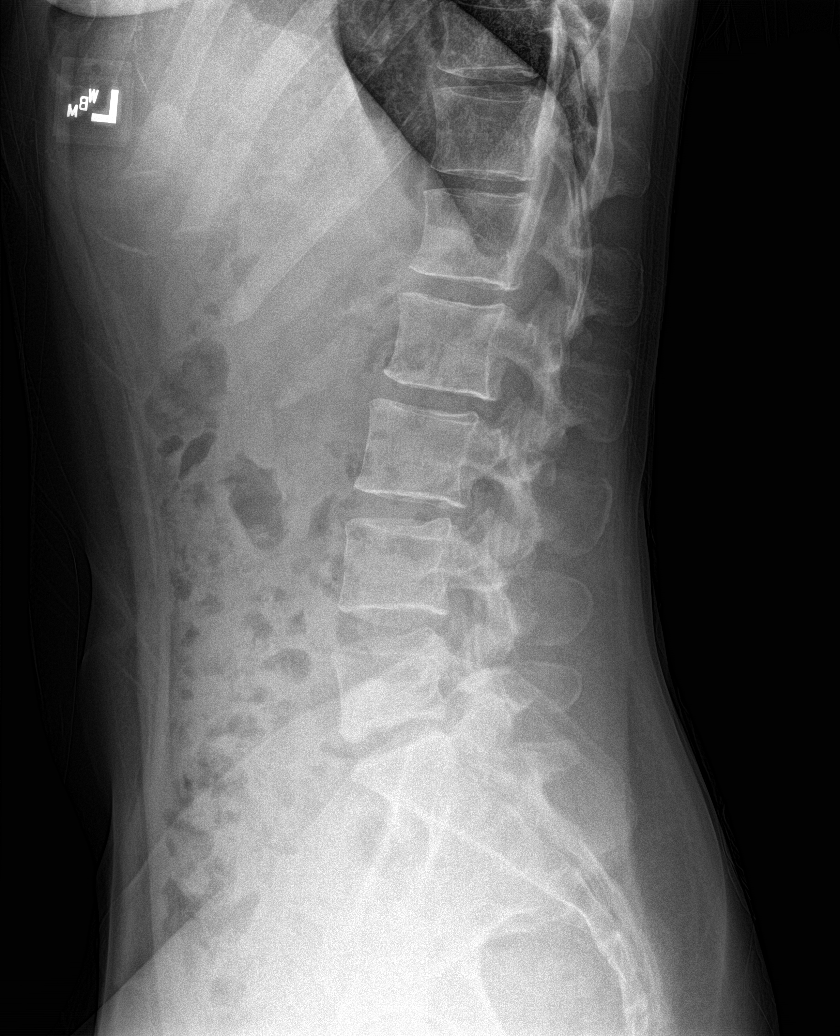

[l-spine spot]
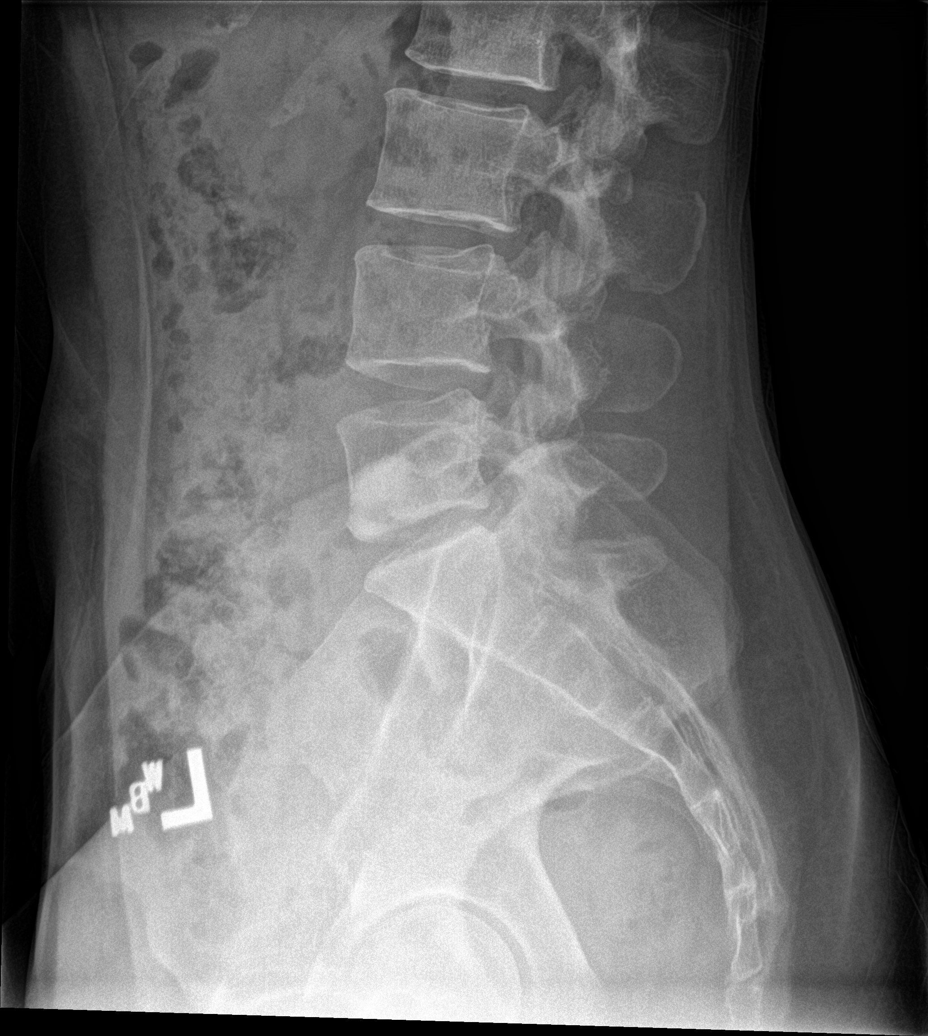

[5 of 5 positions shown; findings below may reference images not displayed]

FINDINGS: Five lumbar type vertebral bodies are well visualized. Vertebral
body height is well maintained. No anterolisthesis is noted. No pars
defects are noted. No soft tissue abnormality is seen.
IMPRESSION: No acute abnormality noted.
